# Patient Record
Sex: Female | Born: 1995 | Race: Black or African American | Hispanic: No | Marital: Married | Smoking: Never smoker
Health system: Southern US, Community
[De-identification: ages and names within clinical notes are randomized; demographics above are authoritative.]

## PROBLEM LIST (undated history)

## (undated) DIAGNOSIS — O165 Unspecified maternal hypertension, complicating the puerperium: Secondary | ICD-10-CM

## (undated) DIAGNOSIS — R768 Other specified abnormal immunological findings in serum: Secondary | ICD-10-CM

## (undated) HISTORY — DX: Other specified abnormal immunological findings in serum: R76.8

## (undated) HISTORY — DX: Unspecified maternal hypertension, complicating the puerperium: O16.5

## (undated) HISTORY — PX: NO PAST SURGERIES: SHX2092

---

## 2015-03-19 ENCOUNTER — Emergency Department (HOSPITAL_COMMUNITY)
Admission: EM | Admit: 2015-03-19 | Discharge: 2015-03-19 | Disposition: A | Discharge status: Home / Self Care | Payer: Medicaid Other | Attending: Emergency Medicine | Admitting: Emergency Medicine

## 2015-03-19 ENCOUNTER — Encounter (HOSPITAL_COMMUNITY): Payer: Self-pay | Admitting: Emergency Medicine

## 2015-03-19 DIAGNOSIS — Z789 Other specified health status: Secondary | ICD-10-CM

## 2015-03-19 DIAGNOSIS — Z309 Encounter for contraceptive management, unspecified: Secondary | ICD-10-CM | POA: Diagnosis not present

## 2015-03-19 NOTE — ED Provider Notes (Signed)
CSN: [id number]     Arrival date & time 03/19/15  1149 History  This chart was scribed for non-physician practitioner Fayrene Helper, PA, working with Lavera Guise, MD, by Tanda Rockers, ED Scribe. This patient was seen in room TR08C/TR08C and the patient's care was started at 1:09 PM.  Chief Complaint  Patient presents with  . birth control implant    The history is provided by the patient. The history is limited by a language barrier. A language interpreter was used.    HPI Comments: Tracy Archer is a 19 y.o. female who presents to the Emergency Department for removal of birth control implant in left upper arm so that she can conceive again. Pt reports getting the [place] 08[place]13/2014 ([place] She does not currently have a PCP in the area. Pt does not any complaints at this time.   History reviewed. No pertinent past medical history. History reviewed. No pertinent past surgical history. No family history on file. Social History  Substance Use Topics  . Smoking status: Never Smoker   . Smokeless tobacco: None  . Alcohol Use: No   OB History    No data available     Review of Systems  Constitutional: Negative for fever and chills.  HENT: Negative for congestion and rhinorrhea.   Gastrointestinal: Negative for nausea, vomiting and abdominal pain.  Genitourinary: Negative for dysuria and hematuria.   Allergies  Review of patient's allergies indicates no known allergies.  Home Medications   Prior to Admission medications   Not on File   Triage Vitals: BP 125/80 mmHg  Pulse 90  Temp(Src) 98.3 F (36.8 C) (Oral)  Resp 20  SpO2 99%   Physical Exam  Constitutional: She is oriented to person, place, and time. She appears well-developed and well-nourished. No distress.  HENT:  Head: Normocephalic and atraumatic.  Eyes: Conjunctivae and EOM are normal.  Neck: Neck supple. No tracheal deviation present.  Cardiovascular: Normal  rate.   Pulmonary/Chest: Effort normal. No respiratory distress.  Musculoskeletal: Normal range of motion.  Implant noted to left upper arm; No signs of infection  Neurological: She is alert and oriented to person, place, and time.  Skin: Skin is warm and dry.  Psychiatric: She has a normal mood and affect. Her behavior is normal.  Nursing note and vitals reviewed.   ED Course  Procedures (including critical care time)  DIAGNOSTIC STUDIES: Oxygen Saturation is 99% on RA, normal by my interpretation.    COORDINATION OF CARE: 1:15 PM-Discussed that we cannot remove the implant in the ED. Will give pt referral to [facility] since she does not have a PCP at this moment. Pt understands and agrees to plan.    Labs Review Labs Reviewed - No data to display  Imaging Review No results found.    EKG Interpretation None      MDM   Final diagnoses:  Uses birth control   BP 125/80 mmHg  Pulse 90  Temp(Src) 98.3 F (36.8 C) (Oral)  Resp 20  SpO2 99%   I personally performed the services described in this documentation, which was scribed in my presence. The recorded information has been reviewed and is accurate.       Fayrene Helper, PA-C 03/19/15 1326  Lavera Guise, MD 03/19/15 [phone]

## 2015-03-19 NOTE — Discharge Instructions (Signed)
°Emergency Department Resource Guide °1) Find a Doctor and Pay Out of Pocket °Although you won't have to find out who is covered by your insurance plan, it is a good idea to ask around and get recommendations. You will then need to call the office and see if the doctor you have chosen will accept you as a new patient and what types of options they offer for patients who are self-pay. Some doctors offer discounts or will set up payment plans for their patients who do not have insurance, but you will need to ask so you aren't surprised when you get to your appointment. ° °2) Contact Your Local Health Department °Not all health departments have doctors that can see patients for sick visits, but many do, so it is worth a call to see if yours does. If you don't know where your local health department is, you can check in your phone book. The CDC also has a tool to help you locate your state's health department, and many state websites also have listings of all of their local health departments. ° °3) Find a Walk-in Clinic °If your illness is not likely to be very severe or complicated, you may want to try a walk in clinic. These are popping up all over the country in pharmacies, drugstores, and shopping centers. They're usually staffed by nurse practitioners or physician assistants that have been trained to treat common illnesses and complaints. They're usually fairly quick and inexpensive. However, if you have serious medical issues or chronic medical problems, these are probably not your best option. ° °No Primary Care Doctor: °- Call [facility] at  832-8000 - they can help you locate a primary care doctor that  accepts your insurance, provides certain services, etc. °- Physician Referral Service- 1-800-533-3463 ° °Chronic Pain Problems: °Organization         Address  Phone   Notes  °[facility]  (336) 297-2271 Patients need to be referred by their primary care doctor.  ° °Medication  Assistance: °Organization         Address  Phone   Notes  °[place] [facility] [address] °[place], [place] [address] (336) 641-8030 --Must be a resident of [place] °-- Must have NO insurance coverage whatsoever (no Medicaid/ Medicare, etc.) °-- The pt. MUST have a primary care doctor that directs their care regularly and follows them in the community °  °[facility]  (866) 331-1348   °[employer]  (888) 892-1162   ° °Agencies that provide inexpensive medical care: °Organization         Address  Phone   Notes  °[facility]  (336) 832-8035   °[facility]    (336) 832-7272   °[facility] [address] °[place], [place] [address] (336) 832-4777   °[facility] [address], °[place] (336) 271-4999   °[facility]    (336) 373-0678   °[facility]    (336) 272-1050   °[facility] ° [address], [place] Phone:  (336) 832-4444, Fax:  (336) 832-4440 Hours of Operation:  9 am - 6 pm, M-F.  Also accepts Medicaid/Medicare and self-pay.  °[facility] ° [address], [place] Phone: (336) 832-3150, Fax: (336) 832-3151. Hours of Operation:  8:30 am - 5:30 pm, M-F.  Also accepts Medicaid and self-pay.  °[facility] [place] [address], [place] Phone: (336) 878-6027   °[facility] [address], [place], [place] (336)723-1848, Ext. 123 Mondays & Thursdays: 7-9 AM.  First 15 patients are seen on a first come, first serve basis. °  ° °Medicaid-accepting [place] Providers: ° °Organization         Address  Phone   Notes  °[facility] [address], [place] (336) 641-2100 Also accepts self-pay patients.  °[facility] [address], [place] ° (336) 856-9996   °[facility] [address], [place]  (336) 288-8857   °[facility] [address] [place] [address], [place] (336) 299-7000   °Veita Bland [address], [place]  ° (336) 373-1557 Only accepts [place] Access Medicaid patients after they have their name applied to their card.  ° °Self-Pay (no insurance) in [place]: ° °Organization         Address  Phone   Notes  °Sickle Cell Patients, [facility] [address], [place] (336) 832-1970   °[facility] [address], [place] (336) 832-4400   °[facility] [place] ° [address], [place] (336) 992-4800   °[facility]/Dr. Osei-Bonsu ° [address] [place] [address], [place] or [address], [place] (336) 841-8500 Phone number for both [place] and [place] locations is the same.  °[facility] [address], [place] (336) 299-0000   °[facility] [address] [place] [address], [employer] or [address] (336) 852-7530 °(336) 878-2260   °[facility] [address], [place] (336) 350-1642, phone; (336) 294-5005, fax Sees patients 1st and 3rd Saturday of every month.  Must not qualify for public or private insurance (i.e. Medicaid, Medicare, [place] Health Choice, Veterans' Benefits) • Household income should be no more than 200% of the poverty level •The clinic cannot treat you if you are pregnant or think you are pregnant • Sexually transmitted diseases are not treated at the clinic.  ° ° °Dental Care: °Organization         Address  Phone  Notes  °[place] [facility] [address], [place] (336) 641-6152 Accepts children up to age 21 who are enrolled in Medicaid or [place] Health Choice; pregnant women with a Medicaid card; and children who have applied for Medicaid or [place] Health Choice, but were declined, whose parents can pay a reduced fee at time of service.  °[place]  [facility] [place]  [address], [place] (336) 641-7733 Accepts children up to age 21 who are enrolled in Medicaid or [place] Health Choice; pregnant women with a Medicaid card; and children who have applied for Medicaid or [place] Health Choice, but were declined, whose parents can pay a reduced fee at time of service.  °[facility] ° [address], [place] (336) 641-4533 Patients are seen by appointment only. Walk-ins are not accepted. [facility] will see patients 18 years of age and older. °Monday - Tuesday (8am-5pm) °Most Wednesdays (8:30-5pm) °$30 per visit, cash only  °[facility] ° [address], [place] (336) 641-4533 Patients are seen by appointment only. Walk-ins are not accepted. [facility] will see patients 18 years of age and older. °One   Wednesday Evening (Monthly: Volunteer Based).  $30 per visit, cash only  °[facility]  (919) 537-3737 for adults; Children under age 4, call Graduate Pediatric Dentistry at (919) [phone]. Children aged 4-14, please call (919) 537-3737 to request a pediatric application. ° Dental services are provided in all areas of dental care including fillings, crowns and bridges, complete and partial dentures, implants, gum treatment, root canals, and extractions. Preventive care is also provided. Treatment is provided to both adults and children. °Patients are selected via a lottery and there is often a waiting list. °  °[facility] [address], °[place] ° (336) 763-8833 [email or web address] °  °[facility] [address], [place], [place] (336)723-1848, Ext. 123 Second and Fourth Thursday of each month, opens at 6:30 AM; Clinic ends at 9 AM.  Patients are seen on a first-come first-served basis, and a limited number are seen during each clinic.  ° °[facility] ° [address], [place], [place] (336) 723-7904    Eligibility Requirements °You must have lived in [place], [place], or [place] counties for at least the last three months. °  You cannot be eligible for state or federal sponsored healthcare insurance, including Veterans Administration, Medicaid, or Medicare. °  You generally cannot be eligible for healthcare insurance through your employer.  °  How to apply: °Eligibility screenings are held every Tuesday and Wednesday afternoon from 1:00 pm until 4:00 pm. You do not need an appointment for the interview!  °[facility] [address], [place], [place] 336-631-2330   °[facility]  336-342-8273   °[facility]  336-703-3100   °[facility]  336-570-6415   ° °Behavioral Health Resources in the Community: °Intensive Outpatient Programs °Organization         Address  Phone  Notes  °[facility] [address], [place], [place] 336-878-6098   °[facility] [address], [place], [place] 336-832-9800   °[facility] [address], [place], [place] ° 336-882-2125   °[facility] [address],  °[place], [place] 1-800-853-5163 or 336-641-4981   °Substance Abuse Resources °Organization         Address  Phone  Notes  °[facility]  336-882-2125   °[facility]  336-784-9470   °[facility]  336-285-9073   °[facility]  336-845-3988   °Residential & Outpatient Substance Abuse Program  1-800-659-3381   °Psychological Services °Organization         Address  Phone  Notes  °[facility]  336- 832-9600   °[facility]  336- 378-7881   °[facility] [address], [place] 1-800-853-5163 or 336-641-4981   ° °Mobile Crisis Teams °Organization         Address  Phone  Notes  °[facility], Mobile Crisis Care Unit  1-877-626-1772   °Assertive °[facility] ° [address]  [place], [place] 336-834-9664   °Sharon DeEsch [address] °[place] 336-554-5454   ° °Self-Help/Support Groups °Organization         Address  Phone             Notes  °[facility] [place] - variety of support groups  336- 373-1402 Call for more information  °Narcotics Anonymous (NA), [facility] [address], °[place]  2 meetings at this location  ° °  Residential Treatment Programs °Organization         Address  Phone  Notes  °[facility] [address],    °[place]  1-866-801-8205   °[facility] ° [address], [place], [place] 704-293-8524   °[facility] [address], [place] 336-845-3988 Admissions: 8am-3pm M-F  °[facility] [address]    °[place], [place] 336-841-1104   °[facility] [address], [place], [place] 336-379-7146   °[facility] [address]  °[place], [place] 336-285-9073   °[facility] - Intensive Outpatient [address], [place], [place] 336-852-3033   °[facility] [address]  °[place], [place] 1-877-615-2722 or 336-784-9470   °[facility] [address] [place], [place] 336-227-7417 Accepts Medicaid  °[facility] [address]  °[place] 1-800-659-3381 Substance Abuse/Addiction Treatment  ° °[place] Behavioral Health Resources °Organization         Address  Phone  Notes  °[facility]  (888) 581-9988   °Julie Brannon, PhD [address] [place], [place]   (336) 349-5553 or (336) 951-0000   °[facility]   [address] °[place], [place] (336) 349-4454   °[facility] [address], [place], [place] (336) 342-8316 Insurance/Medicaid/sponsorship through [facility]  °[facility] [address]                                    [place], [place] (336) 342-8316 Therapy/tele-psych/case    °[facility] [address]  ° [place], [place] (336) 349-2233    °Dr. Arfeen  (336) 349-4544   °[facility] [place]  [employer] [place] [facility] 1) [address], [place] °2) [address], [place] °3)  [address], [place] (336) 349-3220 °(336) 342-7768 ° °(336) 342-8140   °[place] [facility] (336) 342-1394 or (336) 342-3537 (After Hours)    ° ° °

## 2015-03-19 NOTE — ED Notes (Signed)
Patient speaks Swahili.  Used interpreter phone.   Patient has birth control implant in [place].   She wants to conceive again, and wants the implant removed.

## 2015-04-01 ENCOUNTER — Ambulatory Visit: Payer: Medicaid Other | Admitting: Family Medicine

## 2015-04-11 ENCOUNTER — Emergency Department (HOSPITAL_COMMUNITY)
Admission: EM | Admit: 2015-04-11 | Discharge: 2015-04-11 | Discharge status: Against Medical Advice | Payer: Medicaid Other | Attending: Emergency Medicine | Admitting: Emergency Medicine

## 2015-04-11 DIAGNOSIS — Z5321 Procedure and treatment not carried out due to patient leaving prior to being seen by health care provider: Secondary | ICD-10-CM | POA: Diagnosis not present

## 2015-04-22 ENCOUNTER — Ambulatory Visit: Payer: Medicaid Other | Attending: Internal Medicine | Admitting: Internal Medicine

## 2015-04-22 ENCOUNTER — Encounter: Payer: Self-pay | Admitting: Internal Medicine

## 2015-04-22 VITALS — BP 121/77 | HR 73 | Temp 98.7°F | Resp 16 | Ht 65.0 in | Wt 138.0 lb

## 2015-04-22 DIAGNOSIS — Z3046 Encounter for surveillance of implantable subdermal contraceptive: Secondary | ICD-10-CM | POA: Insufficient documentation

## 2015-04-22 NOTE — Progress Notes (Signed)
Patient ID: Tracy Archer, female   DOB: 05-31-1996, 19 y.o.   MRN: [id number]  QI:ONGEXBMWU removal   HPI: Tracy Archer is a 19 y.o. female here today to establish care.  Patient has no past medical history. Patient reports that she had a nexplanon [place] 8 [place] ago [place] before moving to the [place]. She now wants implant removed because she desires pregnancy in the near future. She has no other complaints today.   No Known Allergies History reviewed. No pertinent past medical history. No current outpatient prescriptions on file prior to visit.   No current facility-administered medications on file prior to visit.   History reviewed. No pertinent family history. Social History   Social History  . Marital Status: Married    Spouse Name: N/A  . Number of Children: N/A  . Years of Education: N/A   Occupational History  . Not on file.   Social History Main Topics  . Smoking status: Never Smoker   . Smokeless tobacco: Not on file  . Alcohol Use: No  . Drug Use: No  . Sexual Activity: Not on file   Other Topics Concern  . Not on file   Social History Narrative    Review of Systems: Constitutional: Negative for fever, chills, diaphoresis, activity change, appetite change and fatigue. HENT: Negative for ear pain, nosebleeds, congestion, facial swelling, rhinorrhea, neck pain, neck stiffness and ear discharge.  Eyes: Negative for pain, discharge, redness, itching and visual disturbance. Respiratory: Negative for cough, choking, chest tightness, shortness of breath, wheezing and stridor.  Cardiovascular: Negative for chest pain, palpitations and leg swelling. Gastrointestinal: Negative for abdominal distention. Genitourinary: Negative for dysuria, urgency, frequency, hematuria, flank pain, decreased urine volume, difficulty urinating and dyspareunia.  Musculoskeletal: Negative for back pain, joint swelling, arthralgias and gait problem. Neurological: Negative for  dizziness, tremors, seizures, syncope, facial asymmetry, speech difficulty, weakness, light-headedness, numbness and headaches.  Hematological: Negative for adenopathy. Does not bruise/bleed easily. Psychiatric/Behavioral: Negative for hallucinations, behavioral problems, confusion, dysphoric mood, decreased concentration and agitation.    Objective:   Filed Vitals:   04/22/15 1015  BP: 121/77  Pulse: 73  Temp: 98.7 F (37.1 C)  Resp: 16    Physical Exam: Constitutional: Patient appears well-developed and well-nourished. No distress. HENT: Normocephalic, atraumatic, External right and left ear normal. Oropharynx is clear and moist.  Eyes: Conjunctivae and EOM are normal. PERRLA, no scleral icterus. Neck: Normal ROM. Neck supple. No JVD. No tracheal deviation. No thyromegaly. CVS: RRR, S1/S2 +, no murmurs, no gallops, no carotid bruit.  Pulmonary: Effort and breath sounds normal, no stridor, rhonchi, wheezes, rales.  Abdominal: Soft. BS +,  no distension, tenderness, rebound or guarding.  Musculoskeletal: Normal range of motion. No edema and no tenderness. Left arm nexplanon felt under the skin. Lymphadenopathy: No lymphadenopathy noted, cervical, inguinal or axillary Neuro: Alert. Normal reflexes, muscle tone coordination. No cranial nerve deficit. Skin: Skin is warm and dry. No rash noted. Not diaphoretic. No erythema. No pallor. Psychiatric: Normal mood and affect. Behavior, judgment, thought content normal.  No results found for: WBC, HGB, HCT, MCV, PLT No results found for: CREATININE, BUN, NA, K, CL, CO2  No results found for: HGBA1C Lipid Panel  No results found for: CHOL, TRIG, HDL, CHOLHDL, VLDL, LDLCALC     Assessment and plan:   Tracy Archer was seen today for establish care.  Diagnoses and all orders for this visit:  Encounter for Nexplanon removal Nexplanon removal  consent, signed copy in the  chart. Appropriate time out taken The patient's left arm was prepped  and draped in the usual sterile fashion. Local anaesthesia obtained using 3 cc of 1% lidocaine. Nexplanon was removed per manufacturer's directions. Less than 3 cc blood loss. The removal site was cleaned with alcohol/betadine and a pressure bandage to minimize bruising. There were no complications and the patient tolerated the procedure well. She will return in 3 days for a wound recheck     Due to language barrier, an interpreter was present during the history-taking and subsequent discussion (and for part of the physical exam) with this patient.   Return in about 3 days (around 04/25/2015) for Wound recheck with RN--nexplanon removal site and PRN.       Ambrose FinlandValerie A Keck, NP-C [facility] [phone] 04/22/2015, 11:08 AM

## 2015-04-22 NOTE — Progress Notes (Signed)
[employer]pre[employer] Swahili [phone] C/C Implanon remova No pain today

## 2015-04-29 ENCOUNTER — Ambulatory Visit: Payer: Medicaid Other

## 2015-04-29 ENCOUNTER — Encounter: Payer: Self-pay | Admitting: Internal Medicine

## 2015-04-29 ENCOUNTER — Ambulatory Visit: Payer: Medicaid Other | Attending: Internal Medicine | Admitting: Internal Medicine

## 2015-04-29 VITALS — BP 103/67 | HR 91 | Temp 98.0°F | Resp 16 | Ht 65.0 in | Wt 140.0 lb

## 2015-04-29 DIAGNOSIS — Z5189 Encounter for other specified aftercare: Secondary | ICD-10-CM | POA: Diagnosis not present

## 2015-04-29 NOTE — Progress Notes (Signed)
Interpreter line used Patient is here just to have the site where her nexplanon was  Removed checked

## 2015-04-29 NOTE — Progress Notes (Signed)
 Patient ID: Tracy Archer, female   DOB: , 19 y.o.   MRN: [id number]  CC: wound check   HPI: Tracy GlazierFuraha Archer is a 19 y.o. female here today for a follow up wound check after a nexplanon removal last week. Patient reports that the wound has healed fine and she has no complications of procedure.  Wound appears well healed, no signs of infection.    She may follow up as needed  Ambrose FinlandValerie A Keck, NP 04/29/2015 5:20 PM

## 2015-06-28 NOTE — L&D Delivery Note (Signed)
Delivery Note Admitted in advanced active labor, shortly after arriving to room was anterior lip and pushing, so I was called. AROM bbow as baby was crowning- mod MSF.  At 10:42 AM a viable female was delivered via Vaginal, Spontaneous Delivery (Presentation: direct OA ).  APGAR: 9, 9; weight: pending at time of note. Vigorous infant placed directly on mom's abdomen for bonding/skin-to-skin. Delayed cord clamping, then cord clamped x 2, and cut by me (pt's sister, and pt did not want to).   Placenta status: delivered spontaneously, intact, calcifications covering entire maternal side .  Cord:  with the following complications: none .  Cord pH: not done  Anesthesia:  none Episiotomy: None Lacerations: None Suture Repair: n/a Est. Blood Loss (mL): 100  Mom to postpartum.  Baby to Nursery sometime after I left room (d/t low sats). Plans to breastfeed, no circumcision, undecided about contraception.  Pt did not receive Ampicillin for GBS+ d/t precipitous labor/birth  Noted to have few elevated bp's, asymptomatic, will add pre-e labs and monitor bp's pp.   Tracy Archer, Tracy Archer 03/12/2016, 12:05 PM

## 2015-08-31 ENCOUNTER — Other Ambulatory Visit (HOSPITAL_COMMUNITY): Payer: Self-pay | Admitting: Nurse Practitioner

## 2015-08-31 DIAGNOSIS — Z369 Encounter for antenatal screening, unspecified: Secondary | ICD-10-CM

## 2015-08-31 LAB — OB RESULTS CONSOLE ABO/RH: RH TYPE: NEGATIVE

## 2015-08-31 LAB — OB RESULTS CONSOLE GC/CHLAMYDIA
Chlamydia: NEGATIVE
Gonorrhea: NEGATIVE

## 2015-08-31 LAB — OB RESULTS CONSOLE RPR: RPR: NONREACTIVE

## 2015-08-31 LAB — OB RESULTS CONSOLE HEPATITIS B SURFACE ANTIGEN: HEP B S AG: NEGATIVE

## 2015-08-31 LAB — OB RESULTS CONSOLE RUBELLA ANTIBODY, IGM: Rubella: IMMUNE

## 2015-08-31 LAB — OB RESULTS CONSOLE HIV ANTIBODY (ROUTINE TESTING): HIV: NONREACTIVE

## 2015-08-31 LAB — OB RESULTS CONSOLE ANTIBODY SCREEN: Antibody Screen: NEGATIVE

## 2015-09-11 ENCOUNTER — Encounter (HOSPITAL_COMMUNITY): Payer: Self-pay

## 2015-09-11 ENCOUNTER — Other Ambulatory Visit (HOSPITAL_COMMUNITY): Payer: Self-pay | Admitting: Nurse Practitioner

## 2015-09-11 ENCOUNTER — Ambulatory Visit (HOSPITAL_COMMUNITY)
Admission: RE | Admit: 2015-09-11 | Discharge: 2015-09-11 | Disposition: A | Discharge status: Home / Self Care | Payer: Medicaid Other | Source: Ambulatory Visit | Attending: Nurse Practitioner | Admitting: Nurse Practitioner

## 2015-09-11 DIAGNOSIS — Z3A12 12 weeks gestation of pregnancy: Secondary | ICD-10-CM

## 2015-09-11 DIAGNOSIS — Z369 Encounter for antenatal screening, unspecified: Secondary | ICD-10-CM

## 2015-09-11 DIAGNOSIS — Z36 Encounter for antenatal screening of mother: Secondary | ICD-10-CM | POA: Diagnosis not present

## 2015-09-17 ENCOUNTER — Other Ambulatory Visit (HOSPITAL_COMMUNITY): Payer: Self-pay

## 2016-02-21 LAB — OB RESULTS CONSOLE GBS: GBS: POSITIVE

## 2016-03-12 ENCOUNTER — Inpatient Hospital Stay (HOSPITAL_COMMUNITY)
Admission: AD | Admit: 2016-03-12 | Discharge: 2016-03-14 | DRG: 775 | Disposition: A | Discharge status: Home / Self Care | Payer: Medicaid Other | Source: Ambulatory Visit | Attending: Obstetrics & Gynecology | Admitting: Obstetrics & Gynecology

## 2016-03-12 ENCOUNTER — Encounter (HOSPITAL_COMMUNITY): Payer: Self-pay | Admitting: Certified Nurse Midwife

## 2016-03-12 DIAGNOSIS — Z3483 Encounter for supervision of other normal pregnancy, third trimester: Secondary | ICD-10-CM | POA: Diagnosis present

## 2016-03-12 DIAGNOSIS — R768 Other specified abnormal immunological findings in serum: Secondary | ICD-10-CM

## 2016-03-12 DIAGNOSIS — Z6791 Unspecified blood type, Rh negative: Secondary | ICD-10-CM

## 2016-03-12 DIAGNOSIS — Z3A39 39 weeks gestation of pregnancy: Secondary | ICD-10-CM

## 2016-03-12 DIAGNOSIS — O26893 Other specified pregnancy related conditions, third trimester: Secondary | ICD-10-CM | POA: Diagnosis present

## 2016-03-12 DIAGNOSIS — IMO0001 Reserved for inherently not codable concepts without codable children: Secondary | ICD-10-CM

## 2016-03-12 DIAGNOSIS — O99824 Streptococcus B carrier state complicating childbirth: Principal | ICD-10-CM | POA: Diagnosis present

## 2016-03-12 HISTORY — DX: Other specified abnormal immunological findings in serum: R76.8

## 2016-03-12 LAB — CBC
HCT: 34.7 % — ABNORMAL LOW (ref 36.0–46.0)
Hemoglobin: 12.6 g/dL (ref 12.0–15.0)
MCH: 30.1 pg (ref 26.0–34.0)
MCHC: 36.3 g/dL — ABNORMAL HIGH (ref 30.0–36.0)
MCV: 82.8 fL (ref 78.0–100.0)
PLATELETS: 121 10*3/uL — AB (ref 150–400)
RBC: 4.19 MIL/uL (ref 3.87–5.11)
RDW: 13.2 % (ref 11.5–15.5)
WBC: 7.2 10*3/uL (ref 4.0–10.5)

## 2016-03-12 LAB — PROTEIN / CREATININE RATIO, URINE
CREATININE, URINE: 31 mg/dL
Protein Creatinine Ratio: 1.74 mg/mg{Cre} — ABNORMAL HIGH (ref 0.00–0.15)
TOTAL PROTEIN, URINE: 54 mg/dL

## 2016-03-12 LAB — COMPREHENSIVE METABOLIC PANEL
ALT: 13 U/L — AB (ref 14–54)
AST: 21 U/L (ref 15–41)
Albumin: 3.2 g/dL — ABNORMAL LOW (ref 3.5–5.0)
Alkaline Phosphatase: 178 U/L — ABNORMAL HIGH (ref 38–126)
Anion gap: 7 (ref 5–15)
BILIRUBIN TOTAL: 0.5 mg/dL (ref 0.3–1.2)
BUN: 6 mg/dL (ref 6–20)
CALCIUM: 8.9 mg/dL (ref 8.9–10.3)
CHLORIDE: 108 mmol/L (ref 101–111)
CO2: 19 mmol/L — ABNORMAL LOW (ref 22–32)
CREATININE: 0.63 mg/dL (ref 0.44–1.00)
Glucose, Bld: 100 mg/dL — ABNORMAL HIGH (ref 65–99)
Potassium: 3.8 mmol/L (ref 3.5–5.1)
Sodium: 134 mmol/L — ABNORMAL LOW (ref 135–145)
TOTAL PROTEIN: 7 g/dL (ref 6.5–8.1)

## 2016-03-12 LAB — ABO/RH: ABO/RH(D): O NEG

## 2016-03-12 LAB — TYPE AND SCREEN
ABO/RH(D): O NEG
Antibody Screen: NEGATIVE

## 2016-03-12 LAB — RAPID HIV SCREEN (HIV 1/2 AB+AG)
HIV 1/2 Antibodies: NONREACTIVE
HIV-1 P24 Antigen - HIV24: NONREACTIVE

## 2016-03-12 MED ORDER — SODIUM CHLORIDE 0.9 % IV SOLN
2.0000 g | Freq: Once | INTRAVENOUS | Status: DC
  Filled 2016-03-12: qty 2000

## 2016-03-12 MED ORDER — ONDANSETRON HCL 4 MG/2ML IJ SOLN: 4.0000 mg | Freq: Four times a day (QID) | INTRAMUSCULAR | Status: DC | PRN

## 2016-03-12 MED ORDER — ZOLPIDEM TARTRATE 5 MG PO TABS: 5.0000 mg | ORAL_TABLET | Freq: Every evening | ORAL | Status: DC | PRN

## 2016-03-12 MED ORDER — LACTATED RINGERS IV SOLN
500.0000 mL | INTRAVENOUS | Status: DC | PRN
  Administered 2016-03-12: 1000 mL via INTRAVENOUS

## 2016-03-12 MED ORDER — SOD CITRATE-CITRIC ACID 500-334 MG/5ML PO SOLN: 30.0000 mL | ORAL | Status: DC | PRN

## 2016-03-12 MED ORDER — OXYTOCIN BOLUS FROM INFUSION
500.0000 mL | Freq: Once | INTRAVENOUS | Status: AC
  Administered 2016-03-12: 500 mL via INTRAVENOUS

## 2016-03-12 MED ORDER — SENNOSIDES-DOCUSATE SODIUM 8.6-50 MG PO TABS
2.0000 | ORAL_TABLET | ORAL | Status: DC
  Administered 2016-03-13 – 2016-03-14 (×2): 2 via ORAL
  Filled 2016-03-12 (×2): qty 2

## 2016-03-12 MED ORDER — BISACODYL 10 MG RE SUPP: 10.0000 mg | Freq: Every day | RECTAL | Status: DC | PRN

## 2016-03-12 MED ORDER — HYDROXYZINE HCL 50 MG PO TABS
50.0000 mg | ORAL_TABLET | Freq: Four times a day (QID) | ORAL | Status: DC | PRN
  Filled 2016-03-12: qty 1

## 2016-03-12 MED ORDER — IBUPROFEN 600 MG PO TABS
600.0000 mg | ORAL_TABLET | Freq: Four times a day (QID) | ORAL | Status: DC | PRN
  Administered 2016-03-12 – 2016-03-14 (×6): 600 mg via ORAL
  Filled 2016-03-12 (×6): qty 1

## 2016-03-12 MED ORDER — SODIUM CHLORIDE 0.9% FLUSH: 3.0000 mL | INTRAVENOUS | Status: DC | PRN

## 2016-03-12 MED ORDER — ACETAMINOPHEN 325 MG PO TABS: 650.0000 mg | ORAL_TABLET | ORAL | Status: DC | PRN

## 2016-03-12 MED ORDER — LACTATED RINGERS IV SOLN: INTRAVENOUS | Status: DC

## 2016-03-12 MED ORDER — FLEET ENEMA 7-19 GM/118ML RE ENEM: 1.0000 | ENEMA | Freq: Every day | RECTAL | Status: DC | PRN

## 2016-03-12 MED ORDER — OXYTOCIN 40 UNITS IN LACTATED RINGERS INFUSION - SIMPLE MED
2.5000 [IU]/h | INTRAVENOUS | Status: DC
  Filled 2016-03-12: qty 1000

## 2016-03-12 MED ORDER — TETANUS-DIPHTH-ACELL PERTUSSIS 5-2.5-18.5 LF-MCG/0.5 IM SUSP: 0.5000 mL | Freq: Once | INTRAMUSCULAR | Status: DC

## 2016-03-12 MED ORDER — BENZOCAINE-MENTHOL 20-0.5 % EX AERO: 1.0000 "application " | INHALATION_SPRAY | CUTANEOUS | Status: DC | PRN

## 2016-03-12 MED ORDER — SIMETHICONE 80 MG PO CHEW: 80.0000 mg | CHEWABLE_TABLET | ORAL | Status: DC | PRN

## 2016-03-12 MED ORDER — FLEET ENEMA 7-19 GM/118ML RE ENEM: 1.0000 | ENEMA | RECTAL | Status: DC | PRN

## 2016-03-12 MED ORDER — COCONUT OIL OIL: 1.0000 "application " | TOPICAL_OIL | Status: DC | PRN

## 2016-03-12 MED ORDER — DIPHENHYDRAMINE HCL 25 MG PO CAPS: 25.0000 mg | ORAL_CAPSULE | Freq: Four times a day (QID) | ORAL | Status: DC | PRN

## 2016-03-12 MED ORDER — PRENATAL MULTIVITAMIN CH
1.0000 | ORAL_TABLET | Freq: Every day | ORAL | Status: DC
  Administered 2016-03-13 – 2016-03-14 (×2): 1 via ORAL
  Filled 2016-03-12 (×2): qty 1

## 2016-03-12 MED ORDER — SODIUM CHLORIDE 0.9 % IV SOLN: 250.0000 mL | INTRAVENOUS | Status: DC | PRN

## 2016-03-12 MED ORDER — HYDROMORPHONE HCL 2 MG PO TABS: 2.0000 mg | ORAL_TABLET | ORAL | Status: DC | PRN

## 2016-03-12 MED ORDER — SODIUM CHLORIDE 0.9% FLUSH: 3.0000 mL | Freq: Two times a day (BID) | INTRAVENOUS | Status: DC

## 2016-03-12 MED ORDER — ONDANSETRON HCL 4 MG PO TABS
4.0000 mg | ORAL_TABLET | ORAL | Status: DC | PRN
Start: 2016-03-12 — End: 2016-03-14

## 2016-03-12 MED ORDER — DIBUCAINE 1 % RE OINT: 1.0000 "application " | TOPICAL_OINTMENT | RECTAL | Status: DC | PRN

## 2016-03-12 MED ORDER — MEASLES, MUMPS & RUBELLA VAC ~~LOC~~ INJ
0.5000 mL | INJECTION | Freq: Once | SUBCUTANEOUS | Status: DC
  Filled 2016-03-12: qty 0.5

## 2016-03-12 MED ORDER — OXYCODONE-ACETAMINOPHEN 5-325 MG PO TABS: 2.0000 | ORAL_TABLET | ORAL | Status: DC | PRN

## 2016-03-12 MED ORDER — ONDANSETRON HCL 4 MG/2ML IJ SOLN: 4.0000 mg | INTRAMUSCULAR | Status: DC | PRN

## 2016-03-12 MED ORDER — WITCH HAZEL-GLYCERIN EX PADS: 1.0000 "application " | MEDICATED_PAD | CUTANEOUS | Status: DC | PRN

## 2016-03-12 MED ORDER — OXYCODONE-ACETAMINOPHEN 5-325 MG PO TABS: 1.0000 | ORAL_TABLET | ORAL | Status: DC | PRN

## 2016-03-12 MED ORDER — LIDOCAINE HCL (PF) 1 % IJ SOLN
30.0000 mL | INTRAMUSCULAR | Status: DC | PRN
  Filled 2016-03-12: qty 30

## 2016-03-12 MED ORDER — FENTANYL CITRATE (PF) 100 MCG/2ML IJ SOLN: 50.0000 ug | INTRAMUSCULAR | Status: DC | PRN

## 2016-03-12 NOTE — Lactation Note (Addendum)
This note was copied from a baby's chart. Lactation Consultation Note  Patient Name: Tracy Ottie GlazierFuraha Saraceno UJWJX'BToday's Date: 03/12/2016 Reason for consult: Initial assessment   Initial assessment with Exp BF mom of 9 hour old infant. Spoke with mom with assistance of [employer] and [employer] Interpreter Ureji # [phone]. Received report from Bary RichardSally Hodges that mom's HIV status was non-reactive.   Mom reports BF is going well. She reports she knows how to BF her child and does not need assistance. Enc mom to feed infant 8-12 x in 24 hours at first feeding cues. Infant was awake and crying when I went into the room, mom was changing his diaper and reports he just finished feeding, she was unable to tell me how long he fed. Showed them the feeding log and enc them to write down feeds, voids and stools. Mom and another female in the room.  [facility] and laughed when I asked if she has a pump at home. She reports she is able to hand express her breasts.   BF Resources Handout and LC Brochure given, Mom informed of LC phone # and IP/OP Services. Enc mom to call out to desk if feeding assistance needed.   Mom asked about dressing the baby, she had him swaddled in a thick blanket, enc her to wait until after his bath. She also was asking about taking a shower, told her she would need to talk to her nurse as she still has an IV. Report was given to VenezuelaSydney, Charity fundraiserN.    Maternal Data Formula Feeding for Exclusion: No Has patient been taught Hand Expression?: Yes Does the patient have breastfeeding experience prior to this delivery?: Yes  Feeding    LATCH Score/Interventions                      Lactation Tools Discussed/Used WIC Program: No   Consult Status Consult Status: Follow-up Date: 03/13/16 Follow-up type: In-patient    Silas FloodSharon S Laurana Magistro 03/12/2016, 8:16 PM

## 2016-03-12 NOTE — H&P (Signed)
Tracy Archer is a 20 y.o. G2P1001 female at 2623w0d by LMP c/w 13wk u/s, presenting in advanced active labor.   Reports active fetal movement, contractions: regular, vaginal bleeding: bloody show, membranes: intact. [facility]   Denies ha, visual changes, ruq/epigastric pain, n/v.    This pregnancy complicated by: GBS+, Rh neg  Prenatal History/Complications:  Term uncomplicated svb x 1  Past Medical History: Past Medical History:  Diagnosis Date  . Medical history non-contributory     Past Surgical History: Past Surgical History:  Procedure Laterality Date  . NO PAST SURGERIES      Obstetrical History: OB History    Gravida Para Term Preterm AB Living   2 1 1     1    SAB TAB Ectopic Multiple Live Births                  Social History: Social History   Social History  . Marital status: Married    Spouse name: N/A  . Number of children: N/A  . Years of education: N/A   Social History Main Topics  . Smoking status: Never Smoker  . Smokeless tobacco: Never Used  . Alcohol use No  . Drug use: No  . Sexual activity: Not Asked   Other Topics Concern  . None   Social History Narrative  . None    Family History: History reviewed. No pertinent family history.  Allergies: No Known Allergies  Prescriptions Prior to Admission  Medication Sig Dispense Refill Last Dose  . Prenatal Vit-Fe Fumarate-FA (MULTIVITAMIN-PRENATAL) 27-0.8 MG TABS tablet Take 1 tablet by mouth daily at 12 noon.       Review of Systems  Pertinent pos/neg as indicated in HPI  Blood pressure 139/87, pulse 69, temperature 97.7 F (36.5 C), temperature source Oral, resp. rate 18, height 5\' 4"  (1.626 m), weight 78.9 kg (174 lb), last menstrual period 06/13/2015. General appearance: alert, cooperative and moderate distress/labor Lungs: clear to auscultation bilaterally Heart: regular rate and rhythm Abdomen: gravid, soft, non-tender Extremities: trace  edema DTR's 2+, no clonus  Fetal monitoring: FHR: 135 bpm, variability: moderate,  Accelerations: Present,  decelerations:  Absent Uterine activity: q 2-803mins   Presentation: cephalic   Prenatal labs:  ABO, Rh:  O- Antibody:  neg Rubella: immune RPR:   nr HBsAg:   neg HIV:   nr GBS:   Pos  1 hr Glucola: 121 Genetic screening:  neg Anatomy US: normal   Assessment:  2623w0d SIUP  G2P1001  Advanced active labor  Cat 1 FHR  GBS  Pos  Plan:  Admit to BS  IV pain meds/epidural prn active labor  Expectant management  Anticipate NSVB  Ampicillin for GBS+/advanced labor   Plans to breastfeed  Contraception: undecided  Circumcision: no  [name], Breyah Akhter Randall CNM, WHNP-BC 03/12/2016, 10:06 AM

## 2016-03-12 NOTE — MAU Note (Signed)
Pt states she began contracting last night. Pt speaks Swahili.

## 2016-03-12 NOTE — Lactation Note (Signed)
This note was copied from a baby's chart. Lactation Consultation Note  Patient Name: Tracy Archer WUJWJ'XToday's Date: 03/12/2016   In reviewing mom's chart it was noted in Pediatric notes that mom had possible exposure to HIV during pregnancy. Last HIV test result from 08/2015 was negative. Spoke with Bary RichardSally Hodges and [place] [facility] [place] [facility] RN. Also called Dr. Ronalee RedHartsell who plans to review chart. Per Leonides SakeSally, Mom has an HIV testing pending that may yield report tomorrow. Betsy, RN Spoke with T/S OB to obtain rapid HIV due to HIV being contraidicated with breastfeeding. Mom has been BF infant today. Will follow up once lab results are complete.      Maternal Data    Feeding Feeding Type: Breast Fed  LATCH Score/Interventions Latch: Repeated attempts needed to sustain latch, nipple held in mouth throughout feeding, stimulation needed to elicit sucking reflex. Intervention(s): Adjust position;Assist with latch  Audible Swallowing: A few with stimulation Intervention(s): Skin to skin  Type of Nipple: Everted at rest and after stimulation  Comfort (Breast/Nipple): Soft / non-tender     Hold (Positioning): Assistance needed to correctly position infant at breast and maintain latch. Intervention(s): Breastfeeding basics reviewed;Support Pillows  LATCH Score: 7  Lactation Tools Discussed/Used     Consult Status      Ed BlalockSharon S Rilei Kravitz 03/12/2016, 5:27 PM

## 2016-03-13 LAB — KLEIHAUER-BETKE STAIN
# VIALS RHIG: 1
Fetal Cells %: 0 %
QUANTITATION FETAL HEMOGLOBIN: 0 mL

## 2016-03-13 LAB — HIV ANTIBODY (ROUTINE TESTING W REFLEX): HIV Screen 4th Generation wRfx: NONREACTIVE

## 2016-03-13 MED ORDER — RHO D IMMUNE GLOBULIN 1500 UNIT/2ML IJ SOSY
300.0000 ug | PREFILLED_SYRINGE | Freq: Once | INTRAMUSCULAR | Status: AC
Start: 2016-03-13 — End: 2016-03-13
  Administered 2016-03-13: 300 ug via INTRAVENOUS
  Filled 2016-03-13: qty 2

## 2016-03-13 NOTE — Progress Notes (Addendum)
Post Partum Day 1 Subjective: Eating, drinking, voiding, ambulating well.  +flatus.  Lochia and pain wnl.  Denies dizziness, lightheadedness, or sob. No complaints. Denies ha, visual changes, ruq/epigastric pain, n/v.    Objective: Blood pressure 115/71, pulse 80, temperature 98.1 F (36.7 C), temperature source Oral, resp. rate 12, height 5\' 4"  (1.626 m), weight 78.9 kg (174 lb), last menstrual period 06/13/2015, SpO2 100 %.  Physical Exam:  General: alert, cooperative and no distress Lochia: appropriate Uterine Fundus: firm Incision: n/a DVT Evaluation: No evidence of DVT seen on physical exam. Negative Homan's sign. No cords or calf tenderness. No significant calf/ankle edema.   Recent Labs  03/12/16 0955  HGB 12.6  HCT 34.7*    Assessment/Plan: Plan for discharge tomorrow, Breastfeeding and Contraception undecided  Intrapartum HTN, P:C ratio done from pp urine (ordered as cath, not sure if collected that way) was 1.74, platelets 121. Asymptomatic. Only 1 elevated bp right after delivery, all since have been normal. DTRs 2+, no clonus, trace BLE edema No circ GBS+ and was untreated d/t precipitous labor/birth, so will stay until tomorrow Called from nursery yesterday stating mom had HIV exposure during pregnancy- rapid HIV on admit was neg   LOS: 1 day   [name], Kimberly Randall 03/13/2016, 6:05 AM

## 2016-03-14 ENCOUNTER — Encounter (HOSPITAL_COMMUNITY): Payer: Self-pay | Admitting: *Deleted

## 2016-03-14 LAB — RH IG WORKUP (INCLUDES ABO/RH)
ABO/RH(D): O NEG
FETAL SCREEN: POSITIVE
GESTATIONAL AGE(WKS): 39
Unit division: 0

## 2016-03-14 MED ORDER — IBUPROFEN 600 MG PO TABS: 600.0000 mg | ORAL_TABLET | Freq: Four times a day (QID) | ORAL | 0 refills | Status: DC | PRN

## 2016-03-14 MED ORDER — PENICILLIN G BENZATHINE 1200000 UNIT/2ML IM SUSP
2.4000 10*6.[IU] | Freq: Once | INTRAMUSCULAR | Status: AC
  Administered 2016-03-14: 2.4 10*6.[IU] via INTRAMUSCULAR
  Filled 2016-03-14: qty 4

## 2016-03-14 NOTE — Progress Notes (Signed)
Contacted lab to request an rpr result from 03/12/16, stated the result should reported soon.

## 2016-03-14 NOTE — Discharge Summary (Signed)
OB Discharge Summary     Patient Name: Tracy Archer DOB: May 10, 1996 MRN: [id number]  Date of admission: 03/12/2016 Delivering MD: Shawna Clamp R   Date of discharge: 03/14/2016  Admitting diagnosis: DUE SEPT 23 IN LABOR Intrauterine pregnancy: [redacted]w[redacted]d     Secondary diagnosis:  Active Problems:   Active labor at term  Additional problems: n/a     Discharge diagnosis: Term Pregnancy Delivered                                                                                                Post partum procedures:n/a  Augmentation: n/a  Complications: None  Hospital course:  Onset of Labor With Vaginal Delivery     20 y.o. yo G2P1001 at [redacted]w[redacted]d was admitted in Active Labor on 03/12/2016. Patient had an uncomplicated labor course as follows:  Membrane Rupture Time/Date: 10:41 AM ,03/12/2016   Intrapartum Procedures: Episiotomy: None [1]                                         Lacerations:  None [1]  Patient had a delivery of a Viable infant. 03/12/2016  Information for the patient's newborn:  Karissa, Meenan [[id number]]  Delivery Method: Vaginal, Spontaneous Delivery (Filed from Delivery Summary)    Pateint had an uncomplicated postpartum course.  She is ambulating, tolerating a regular diet, passing flatus, and urinating well. Patient is discharged home in stable condition on 03/14/16.    Physical exam Vitals:   03/13/16 0000 03/13/16 0500 03/13/16 1849 03/14/16 0607  BP: 115/71 (!) 141/65 120/75 116/74  Pulse:  68 79 79  Resp:  18 16 18   Temp:  98.2 F (36.8 C) 98.2 F (36.8 C) 98 F (36.7 C)  TempSrc:  Oral Oral Oral  SpO2:   100%   Weight:      Height:       General: alert, cooperative and no distress Lochia: appropriate Uterine Fundus: firm Incision: N/A DVT Evaluation: No evidence of DVT seen on physical exam. Negative Homan's sign. No cords or calf tenderness. No significant calf/ankle edema. Labs: Lab Results  Component Value Date   WBC 7.2  03/12/2016   HGB 12.6 03/12/2016   HCT 34.7 (L) 03/12/2016   MCV 82.8 03/12/2016   PLT 121 (L) 03/12/2016   CMP Latest Ref Rng & Units 03/12/2016  Glucose 65 - 99 mg/dL 782(N)  BUN 6 - 20 mg/dL 6  Creatinine 5.62 - 1.30 mg/dL 8.65  Sodium 784 - 696 mmol/L 134(L)  Potassium 3.5 - 5.1 mmol/L 3.8  Chloride 101 - 111 mmol/L 108  CO2 22 - 32 mmol/L 19(L)  Calcium 8.9 - 10.3 mg/dL 8.9  Total Protein 6.5 - 8.1 g/dL 7.0  Total Bilirubin 0.3 - 1.2 mg/dL 0.5  Alkaline Phos 38 - 126 U/L 178(H)  AST 15 - 41 U/L 21  ALT 14 - 54 U/L 13(L)    Discharge instruction: per After Visit Summary and "Baby and Me Booklet".    Diet: routine diet  Activity: Advance as tolerated. Pelvic rest for 6 weeks.   Outpatient follow up:6 weeks Follow up Appt:No future appointments. Follow up Visit:No Follow-up on file.  Postpartum contraception: Natural Family Planning  Newborn Data: Live born female  Birth Weight: 6 lb 11.6 oz (3050 g) APGAR: 9, 9  Baby Feeding: Breast Disposition:home with mother   03/14/2016 Renetta ChalkAshley Ellis, Student-MidWife

## 2016-03-14 NOTE — Lactation Note (Signed)
This note was copied from a baby's chart. Lactation Consultation Note  Alphia MohSwahili Pacifica [phone]#[id number]. Mother states she hasn't been able to breastfeed on her L nipple because it is abnormal. L nipples is flatter and larger than R nipple.  Provided mother with a manual pump with #30 flange.  Prepumped to help evert nipple. Colostrum started flowing. She prepumped on left side and baby latched easily. Sucks and swallows observed. Mom encouraged to feed baby 8-12 times/24 hours and with feeding cues.  Reviewed engorgement care and monitoring voids/stools.   Patient Name: [name] WJXBJ'YToday's Date: 03/14/2016 Reason for consult: Follow-up assessment   Maternal Data    Feeding Feeding Type: Breast Fed  LATCH Score/Interventions Latch: Grasps breast easily, tongue down, lips flanged, rhythmical sucking.  Audible Swallowing: Spontaneous and intermittent Intervention(s): Skin to skin  Type of Nipple: Everted at rest and after stimulation (L semi flat)  Comfort (Breast/Nipple): Soft / non-tender     Hold (Positioning): Assistance needed to correctly position infant at breast and maintain latch.  LATCH Score: 9  Lactation Tools Discussed/Used     Consult Status Consult Status: Complete    Hardie PulleyBerkelhammer, Ruth Boschen 03/14/2016, 9:34 AM

## 2016-03-15 LAB — RPR, QUANT+TP ABS (REFLEX)
Rapid Plasma Reagin, Quant: 1:1 {titer} — ABNORMAL HIGH
T Pallidum Abs: NEGATIVE

## 2016-03-15 LAB — RPR: RPR Ser Ql: REACTIVE — AB

## 2016-03-16 ENCOUNTER — Encounter: Payer: Self-pay | Admitting: Obstetrics & Gynecology

## 2016-03-19 NOTE — Discharge Summary (Signed)
Discharge Summaries Cosign Needed  Date of Service: 03/14/2016 7:26 AM Tracy MoritaMarie D Bernadette Gores, CNM  Obstetrics  Expand All Collapse All   [] Hide copied text [] Hover for attribution information                          OB Discharge Summary                           Patient Name: Tracy Archer DOB:  MRN: [id number]  Date of admission: 03/12/2016 Delivering MD: Tracy Archer   Date of discharge: 03/14/2016  Admitting diagnosis: DUE SEPT 23 IN LABOR Intrauterine pregnancy: 1647w2d     Secondary diagnosis:  Active Problems:   Active labor at term  Additional problems: n/a                                      Discharge diagnosis: Term Pregnancy Delivered                                                                                                Post partum procedures:n/a  Augmentation: n/a  Complications: None  Hospital course:  Onset of Labor With Vaginal Delivery     20 y.o. yo G2P1001 at 247w2d was admitted in Active Labor on 03/12/2016. Patient had an uncomplicated labor course as follows:  Membrane Rupture Time/Date: 10:41 AM ,03/12/2016   Intrapartum Procedures: Episiotomy: None [1]                                         Lacerations:  None [1]  Patient had a delivery of a Viable infant. 03/12/2016  Information for the patient's newborn:  Tracy Archer, Boy Tracy Archer [[id number]][[id number]]  Delivery Method: Vaginal, Spontaneous Delivery (Filed from Delivery Summary)    Pateint had an uncomplicated postpartum course.  She is ambulating, tolerating a regular diet, passing flatus, and urinating well. Patient is discharged home in stable condition on 03/14/16.          Physical exam Vitals:   03/13/16 0000 03/13/16 0500 03/13/16 1849 03/14/16 0607  BP: 115/71 (!) 141/65 120/75 116/74  Pulse:  68 79 79  Resp:  18 16 18   Temp:  98.2 F (36.8 C) 98.2 F (36.8 C) 98 F (36.7 C)  TempSrc:  Oral Oral Oral  SpO2:   100%   Weight:      Height:        General: alert, cooperative and no distress Lochia: appropriate Uterine Fundus: firm Incision: N/A DVT Evaluation: No evidence of DVT seen on physical exam. Negative Homan's sign. No cords or calf tenderness. No significant calf/ankle edema. Labs: RecentLabs       Lab Results  Component Value Date   WBC 7.2 03/12/2016   HGB 12.6 03/12/2016   HCT 34.7 (L) 03/12/2016   MCV 82.8 03/12/2016   PLT 121 (L) 03/12/2016  CMP Latest Ref Rng & Units 03/12/2016  Glucose 65 - 99 mg/dL 213(Y)  BUN 6 - 20 mg/dL 6  Creatinine 8.65 - 7.84 mg/dL 6.96  Sodium 295 - 284 mmol/L 134(L)  Potassium 3.5 - 5.1 mmol/L 3.8  Chloride 101 - 111 mmol/L 108  CO2 22 - 32 mmol/L 19(L)  Calcium 8.9 - 10.3 mg/dL 8.9  Total Protein 6.5 - 8.1 g/dL 7.0  Total Bilirubin 0.3 - 1.2 mg/dL 0.5  Alkaline Phos 38 - 126 U/L 178(H)  AST 15 - 41 U/L 21  ALT 14 - 54 U/L 13(L)    Discharge instruction: per After Visit Summary and "Baby and Me Booklet".    Diet: routine diet  Activity: Advance as tolerated. Pelvic rest for 6 weeks.   Outpatient follow up:6 weeks Follow up Appt:No future appointments. Follow up Visit:No Follow-up on file.  Postpartum contraception: Natural Family Planning  Newborn Data: Live born female  Birth Weight: 6 lb 11.6 oz (3050 g) APGAR: 9, 9  Baby Feeding: Breast Disposition:home with mother   03/14/2016 Tracy Archer, Student-MidWife      Electronically signed by Tracy Archer, Student-MidWife at 03/14/2016 7:28 AM Electronically signed by Tracy Archer, CNM at 03/19/2016 3:52 PM      Admission (Discharged) on 03/12/2016        Revision History        Detailed Report

## 2017-06-27 NOTE — L&D Delivery Note (Signed)
Delivery Note Pt progressed quickly to complete and pushed well. First dose of Amp 2gm was infusing at time of delivery. At 7:43 AM a viable female was delivered via Vaginal, Spontaneous (Presentation: LOA).  APGAR: 8, 9; weight: pending .  Infant dried and placed on pt's abd. Cord clamped and cut by CNM; hospital cord blood sample collected. Placenta status: spont, intact.  Cord: 3 vessel  Anesthesia:  None Episiotomy: None Lacerations: None Est. Blood Loss (mL): 100  Mom to postpartum.  Baby to Couplet care / Skin to Skin.  Cam HaiSHAW, KIMBERLY CNM 10/14/2017, 8:12 AM

## 2017-07-16 IMAGING — US US MFM FETAL NUCHAL TRANSLUCENCY
2 series · 15 of 28 positions shown · non-contrast
Comparison: none

[Series 1: us mfm fetal nuchal translucency · 12 of 32 slices shown (1 of 2)]
[im 1/32]
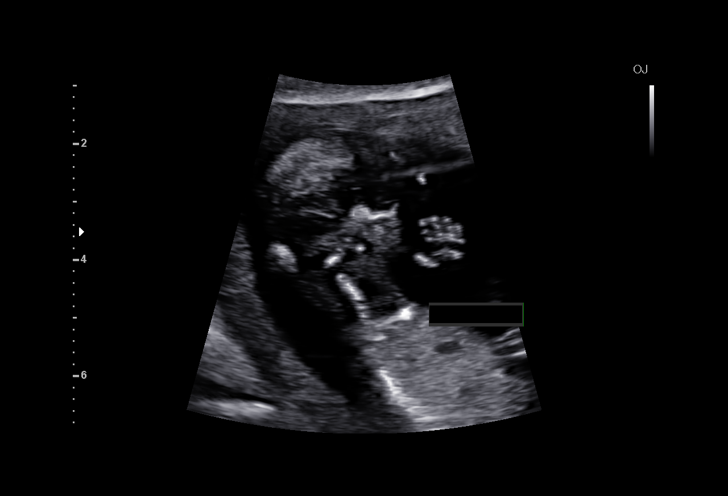
[im 3/32]
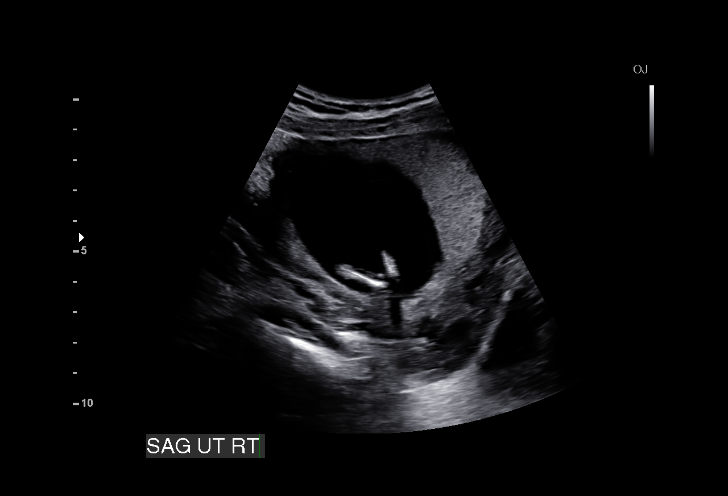
[im 6/32]
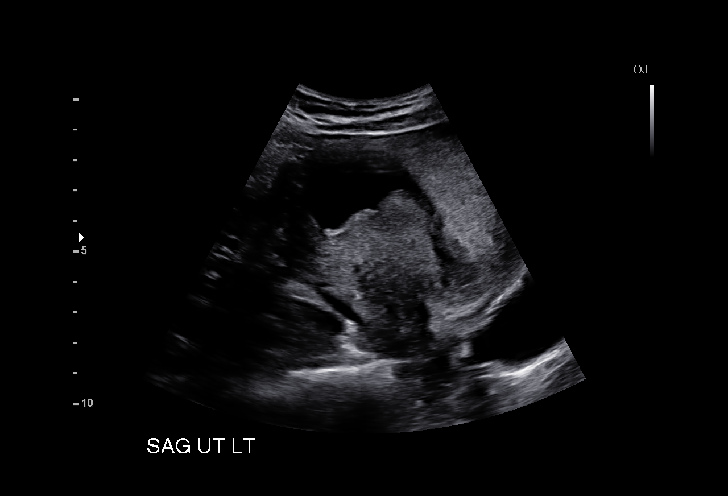
[im 9/32]
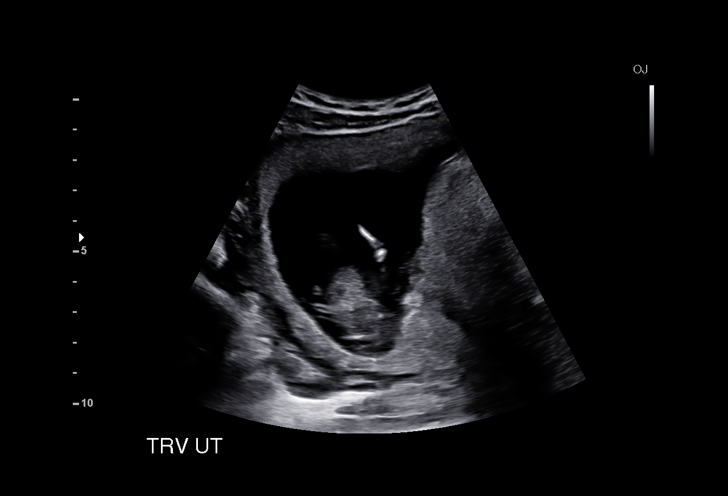
[im 12/32]
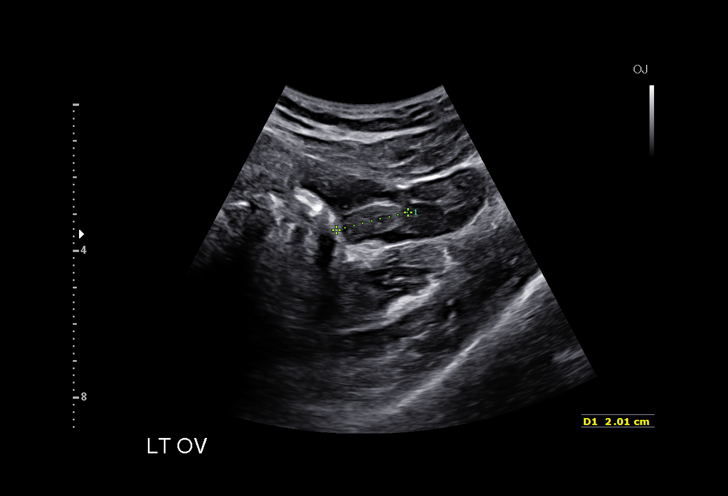
[im 15/32]
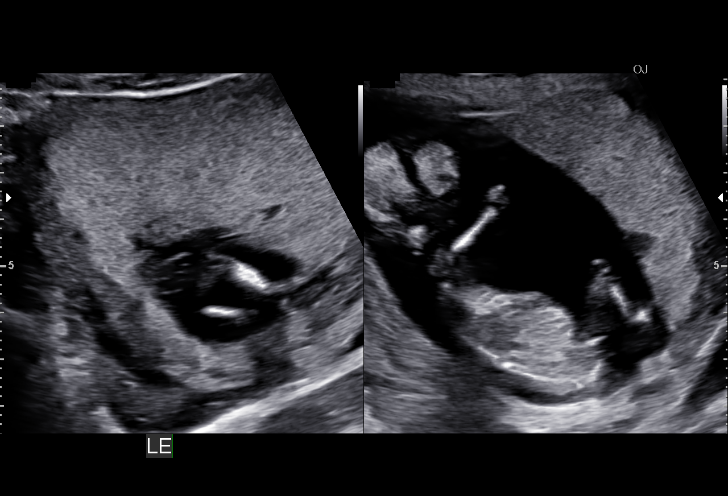
[im 18/32]
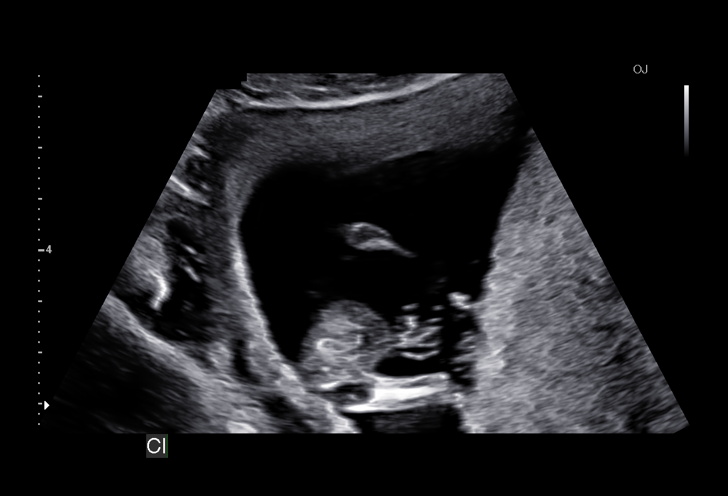
[im 21/32]
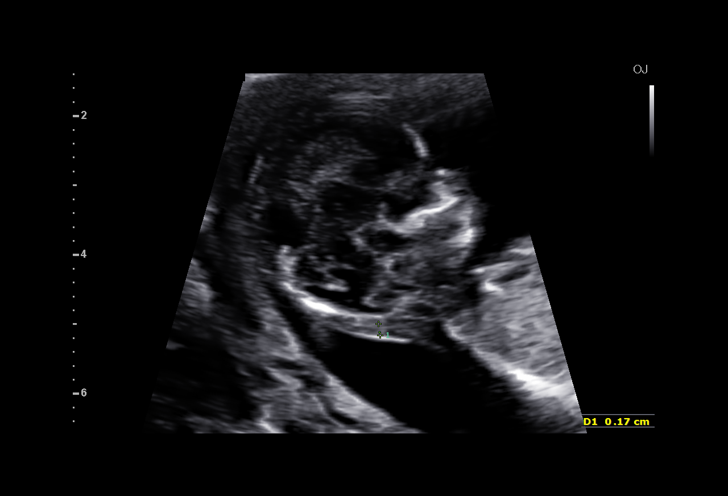
[im 23/32]
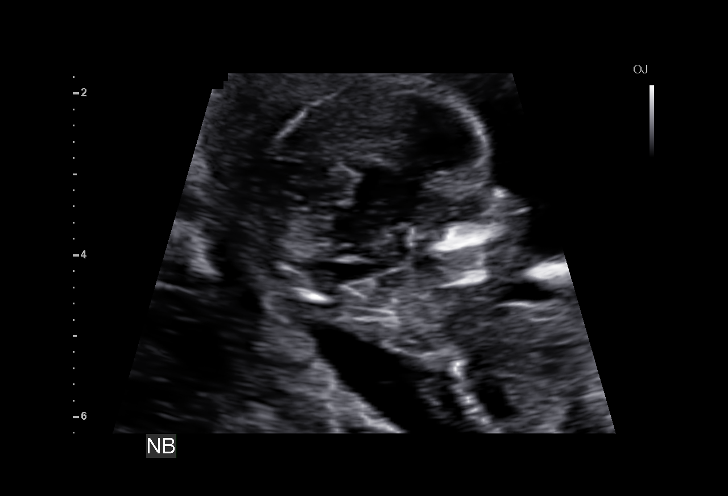
[im 26/32]
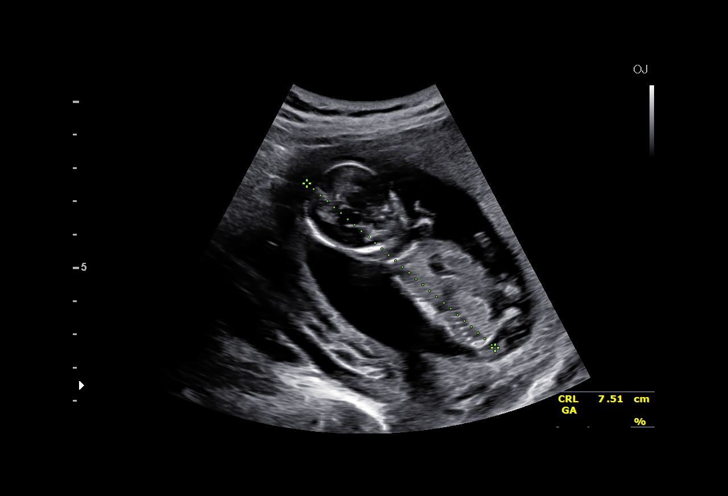
[im 29/32]
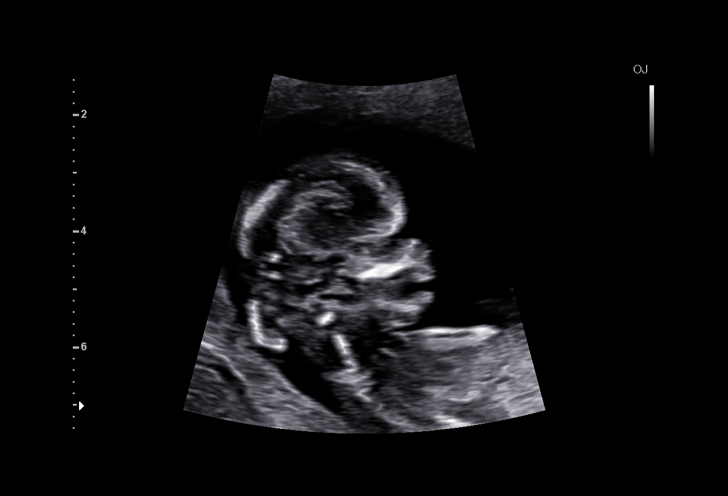
[im 32/32]
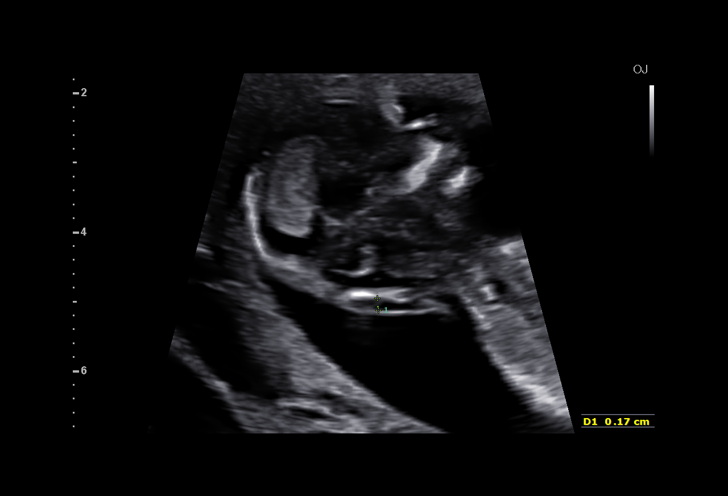

[Series 3: us mfm fetal nuchal translucency · 3 of 8 slices shown (2 of 2)]
[im 2/8]
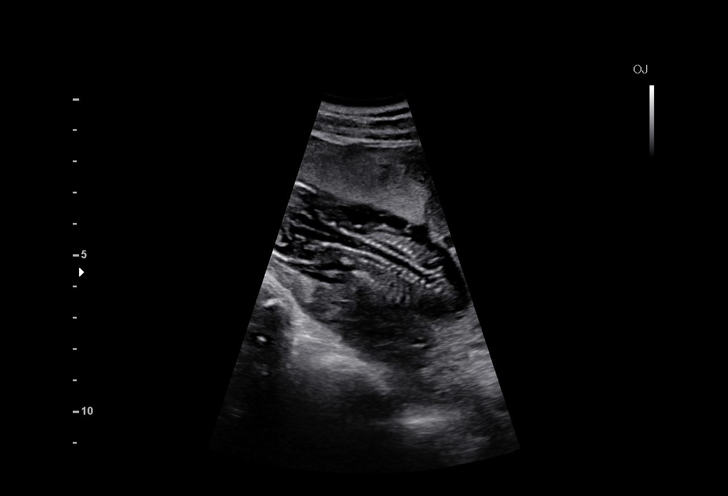
[im 5/8]
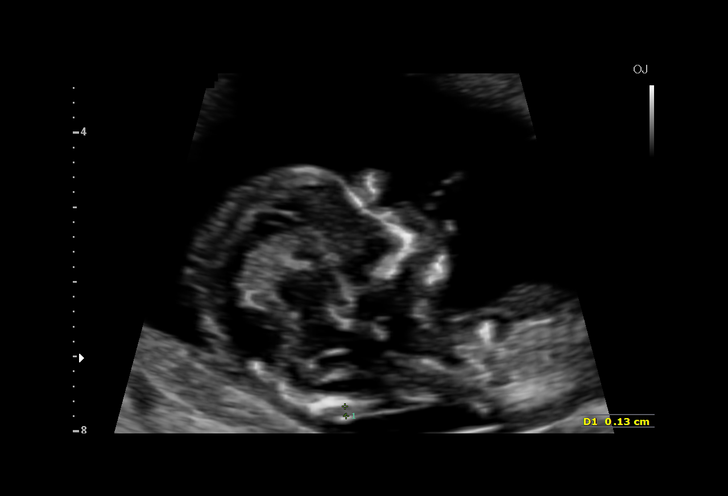
[im 8/8]
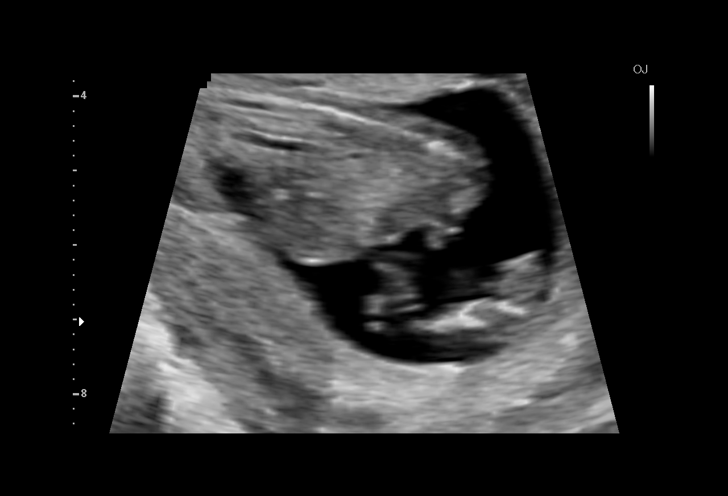

[15 of 28 positions shown; findings below may reference images not displayed]

Health
KITTRANANDEN

TRANSLUCENCY

1  MAGDA M                [id number]      [id number]     [id number]
KITTRANANDEN
Indications

12 weeks gestation of pregnancy
First trimester aneuploidy screen (NT)         Z36
OB History

Height:       5'5"    Weight:   140       BMI:
Gravidity:    2         Term:   1        Prem:   0        SAB:   0
TOP:          0       Ectopic:  0        Living: 1
Fetal Evaluation

Num Of Fetuses:     1
Preg. Location:     Intrauterine
Gest. Sac:          Intrauterine
Fetal Pole:         Visualized
Fetal Heart         157
Rate(bpm):
Cardiac Activity:   Observed
Biometry

CRL:      72.2  mm     G. Age:  13w 1d                  EDD:   03/17/16
Gestational Age

LMP:           12w 6d       Date:   06/13/15                 EDD:   03/19/16
Best:          12w 6d    Det. By:   LMP  (06/13/15)          EDD:   03/19/16
1st Trimester Genetic Sonogram Screening

CRL:            72.2  mm    G. Age:   13w 1d                 EDD:   03/17/16
Nuc Trans:       1.5  mm
Nasal Bone:                 Present
Anatomy

Cranium:          Appears normal         Abdomen:          Appears normal
Choroid Plexus:   Appears normal         Abdominal Wall:   Appears nml (cord
insert, abd wall)
Stomach:          Appears normal, left
sided
Impression

SIUP at 12+6 weeks
No gross abnormalities identified
NT measurement was within normal limits for this GA; NB
present
Normal amniotic fluid volume
Measurements consistent with LMP dating
Recommendations

Offer MSAFP in the second trimester for ONTD screening
Offer anatomy U/S by 18 weeks

## 2017-10-14 ENCOUNTER — Inpatient Hospital Stay (HOSPITAL_COMMUNITY)
Admission: AD | Admit: 2017-10-14 | Discharge: 2017-10-16 | DRG: 807 | Disposition: A | Discharge status: Home / Self Care | Payer: Medicaid Other | Source: Ambulatory Visit | Attending: Family Medicine | Admitting: Family Medicine

## 2017-10-14 ENCOUNTER — Encounter (HOSPITAL_COMMUNITY): Payer: Self-pay | Admitting: *Deleted

## 2017-10-14 ENCOUNTER — Other Ambulatory Visit: Payer: Self-pay

## 2017-10-14 DIAGNOSIS — O1414 Severe pre-eclampsia complicating childbirth: Principal | ICD-10-CM | POA: Diagnosis present

## 2017-10-14 DIAGNOSIS — O99824 Streptococcus B carrier state complicating childbirth: Secondary | ICD-10-CM | POA: Diagnosis present

## 2017-10-14 DIAGNOSIS — Z3A39 39 weeks gestation of pregnancy: Secondary | ICD-10-CM | POA: Diagnosis not present

## 2017-10-14 DIAGNOSIS — O1495 Unspecified pre-eclampsia, complicating the puerperium: Secondary | ICD-10-CM | POA: Diagnosis not present

## 2017-10-14 LAB — CBC
HCT: 34.6 % — ABNORMAL LOW (ref 36.0–46.0)
Hemoglobin: 12.4 g/dL (ref 12.0–15.0)
MCH: 30.2 pg (ref 26.0–34.0)
MCHC: 35.8 g/dL (ref 30.0–36.0)
MCV: 84.4 fL (ref 78.0–100.0)
Platelets: 154 10*3/uL (ref 150–400)
RBC: 4.1 MIL/uL (ref 3.87–5.11)
RDW: 12.8 % (ref 11.5–15.5)
WBC: 8.9 10*3/uL (ref 4.0–10.5)

## 2017-10-14 LAB — COMPREHENSIVE METABOLIC PANEL
ALK PHOS: 416 U/L — AB (ref 38–126)
ALT: 15 U/L (ref 14–54)
AST: 21 U/L (ref 15–41)
Albumin: 3 g/dL — ABNORMAL LOW (ref 3.5–5.0)
Anion gap: 12 (ref 5–15)
BILIRUBIN TOTAL: 0.3 mg/dL (ref 0.3–1.2)
BUN: 10 mg/dL (ref 6–20)
CALCIUM: 8.9 mg/dL (ref 8.9–10.3)
CO2: 16 mmol/L — ABNORMAL LOW (ref 22–32)
CREATININE: 0.78 mg/dL (ref 0.44–1.00)
Chloride: 107 mmol/L (ref 101–111)
GFR calc Af Amer: >60 mL/min (ref >60)
Glucose, Bld: 84 mg/dL (ref 65–99)
POTASSIUM: 3.9 mmol/L (ref 3.5–5.1)
Sodium: 135 mmol/L (ref 135–145)
TOTAL PROTEIN: 6.6 g/dL (ref 6.5–8.1)

## 2017-10-14 LAB — PROTEIN / CREATININE RATIO, URINE
CREATININE, URINE: 19 mg/dL
Total Protein, Urine: <6 mg/dL

## 2017-10-14 MED ORDER — LACTATED RINGERS IV SOLN
INTRAVENOUS | Status: DC
  Administered 2017-10-14: 07:00:00 via INTRAVENOUS

## 2017-10-14 MED ORDER — PRENATAL MULTIVITAMIN CH
1.0000 | ORAL_TABLET | Freq: Every day | ORAL | Status: DC
  Administered 2017-10-14 – 2017-10-15 (×2): 1 via ORAL
  Filled 2017-10-14 (×2): qty 1

## 2017-10-14 MED ORDER — DIPHENHYDRAMINE HCL 25 MG PO CAPS: 25.0000 mg | ORAL_CAPSULE | Freq: Four times a day (QID) | ORAL | Status: DC | PRN

## 2017-10-14 MED ORDER — DIBUCAINE 1 % RE OINT: 1.0000 "application " | TOPICAL_OINTMENT | RECTAL | Status: DC | PRN

## 2017-10-14 MED ORDER — OXYTOCIN 10 UNIT/ML IJ SOLN
INTRAMUSCULAR | Status: AC
  Filled 2017-10-14: qty 1

## 2017-10-14 MED ORDER — LIDOCAINE HCL (PF) 1 % IJ SOLN
INTRAMUSCULAR | Status: AC
  Filled 2017-10-14: qty 30

## 2017-10-14 MED ORDER — OXYTOCIN 40 UNITS IN LACTATED RINGERS INFUSION - SIMPLE MED
INTRAVENOUS | Status: AC
  Filled 2017-10-14: qty 1000

## 2017-10-14 MED ORDER — SODIUM CHLORIDE 0.9 % IV SOLN
2.0000 g | INTRAVENOUS | Status: AC
  Administered 2017-10-14: 2 g via INTRAVENOUS
  Filled 2017-10-14: qty 2

## 2017-10-14 MED ORDER — OXYTOCIN BOLUS FROM INFUSION
500.0000 mL | Freq: Once | INTRAVENOUS | Status: AC
  Administered 2017-10-14: 500 mL via INTRAVENOUS

## 2017-10-14 MED ORDER — ACETAMINOPHEN 325 MG PO TABS: 650.0000 mg | ORAL_TABLET | ORAL | Status: DC | PRN

## 2017-10-14 MED ORDER — SENNOSIDES-DOCUSATE SODIUM 8.6-50 MG PO TABS
2.0000 | ORAL_TABLET | ORAL | Status: DC
  Administered 2017-10-15 – 2017-10-16 (×2): 2 via ORAL
  Filled 2017-10-14 (×2): qty 2

## 2017-10-14 MED ORDER — SOD CITRATE-CITRIC ACID 500-334 MG/5ML PO SOLN: 30.0000 mL | ORAL | Status: DC | PRN

## 2017-10-14 MED ORDER — WITCH HAZEL-GLYCERIN EX PADS: 1.0000 "application " | MEDICATED_PAD | CUTANEOUS | Status: DC | PRN

## 2017-10-14 MED ORDER — OXYTOCIN 40 UNITS IN LACTATED RINGERS INFUSION - SIMPLE MED: 2.5000 [IU]/h | INTRAVENOUS | Status: DC

## 2017-10-14 MED ORDER — OXYCODONE-ACETAMINOPHEN 5-325 MG PO TABS: 2.0000 | ORAL_TABLET | ORAL | Status: DC | PRN

## 2017-10-14 MED ORDER — MAGNESIUM SULFATE BOLUS VIA INFUSION
4.0000 g | Freq: Once | INTRAVENOUS | Status: AC
  Administered 2017-10-14: 4 g via INTRAVENOUS
  Filled 2017-10-14: qty 500

## 2017-10-14 MED ORDER — ZOLPIDEM TARTRATE 5 MG PO TABS: 5.0000 mg | ORAL_TABLET | Freq: Every evening | ORAL | Status: DC | PRN

## 2017-10-14 MED ORDER — OXYCODONE HCL 5 MG PO TABS: 5.0000 mg | ORAL_TABLET | ORAL | Status: DC | PRN

## 2017-10-14 MED ORDER — LACTATED RINGERS IV SOLN
INTRAVENOUS | Status: DC
  Administered 2017-10-14 – 2017-10-15 (×3): via INTRAVENOUS

## 2017-10-14 MED ORDER — MAGNESIUM SULFATE 40 G IN LACTATED RINGERS - SIMPLE
2.0000 g/h | INTRAVENOUS | Status: AC
  Administered 2017-10-15: 2 g/h via INTRAVENOUS
  Filled 2017-10-14: qty 40
  Filled 2017-10-14: qty 500

## 2017-10-14 MED ORDER — ONDANSETRON HCL 4 MG/2ML IJ SOLN: 4.0000 mg | INTRAMUSCULAR | Status: DC | PRN

## 2017-10-14 MED ORDER — LIDOCAINE HCL (PF) 1 % IJ SOLN
30.0000 mL | INTRAMUSCULAR | Status: DC | PRN
  Filled 2017-10-14: qty 30

## 2017-10-14 MED ORDER — OXYCODONE-ACETAMINOPHEN 5-325 MG PO TABS: 1.0000 | ORAL_TABLET | ORAL | Status: DC | PRN

## 2017-10-14 MED ORDER — IBUPROFEN 600 MG PO TABS
600.0000 mg | ORAL_TABLET | Freq: Four times a day (QID) | ORAL | Status: DC
  Administered 2017-10-14 – 2017-10-16 (×8): 600 mg via ORAL
  Filled 2017-10-14 (×8): qty 1

## 2017-10-14 MED ORDER — BENZOCAINE-MENTHOL 20-0.5 % EX AERO: 1.0000 "application " | INHALATION_SPRAY | CUTANEOUS | Status: DC | PRN

## 2017-10-14 MED ORDER — SIMETHICONE 80 MG PO CHEW: 80.0000 mg | CHEWABLE_TABLET | ORAL | Status: DC | PRN

## 2017-10-14 MED ORDER — ONDANSETRON HCL 4 MG PO TABS: 4.0000 mg | ORAL_TABLET | ORAL | Status: DC | PRN

## 2017-10-14 MED ORDER — PENICILLIN G POT IN DEXTROSE 60000 UNIT/ML IV SOLN
3.0000 10*6.[IU] | INTRAVENOUS | Status: DC
  Filled 2017-10-14: qty 50

## 2017-10-14 MED ORDER — TETANUS-DIPHTH-ACELL PERTUSSIS 5-2.5-18.5 LF-MCG/0.5 IM SUSP: 0.5000 mL | Freq: Once | INTRAMUSCULAR | Status: DC

## 2017-10-14 MED ORDER — ONDANSETRON HCL 4 MG/2ML IJ SOLN: 4.0000 mg | Freq: Four times a day (QID) | INTRAMUSCULAR | Status: DC | PRN

## 2017-10-14 MED ORDER — LACTATED RINGERS IV SOLN: 500.0000 mL | INTRAVENOUS | Status: DC | PRN

## 2017-10-14 MED ORDER — COCONUT OIL OIL: 1.0000 "application " | TOPICAL_OIL | Status: DC | PRN

## 2017-10-14 MED ORDER — SODIUM CHLORIDE 0.9 % IV SOLN
5.0000 10*6.[IU] | Freq: Once | INTRAVENOUS | Status: DC
  Filled 2017-10-14: qty 5

## 2017-10-14 NOTE — Progress Notes (Signed)
Interpreter [phone] [facility]  Instruction given r.e. Baby in room and safety.  Patient states understanding and has no questions at this time.  Will continue to monitor.

## 2017-10-14 NOTE — Progress Notes (Signed)
Interpreter [phone]#[id number] used to explain to pt that she is being started on Magnesium Sulfate as ordered by MD. She states she understands. Pt Still denies any headache, blurred vision or floaters. Pt aware we will have to keep up with intake and output on this medication. Pt states she has no questions or concerns at this time. Will continue to monitor.

## 2017-10-14 NOTE — Progress Notes (Signed)
Interpreter # [phone] used to explain lab work, urine sample via I/o cath and blood pressures. Pt still denies any pain. Pt denies blurred vision, floaters, or head ache at this time. Pt refusing Motrin also. CNM updated and aware. Will continue to monitor.

## 2017-10-14 NOTE — H&P (Signed)
LABOR AND DELIVERY ADMISSION HISTORY AND PHYSICAL NOTE  Tracy Archer is a 22 y.o. female G3P1001 with IUP at [redacted]w[redacted]d by LMP presenting for SOL.  She reports positive fetal movement. She denies leakage of fluid or vaginal bleeding.  Prenatal History/Complications: PNC at HD Pregnancy complications:  - None   Past Medical History: Past Medical History:  Diagnosis Date  . Biological false positive RPR test 03/12/2016   Quant was 1:1, negative confirmatory T.pallidum antibodies    Past Surgical History: Past Surgical History:  Procedure Laterality Date  . NO PAST SURGERIES      Obstetrical History: OB History    Gravida  3   Para  1   Term  1   Preterm      AB      Living  1     SAB      TAB      Ectopic      Multiple      Live Births              Social History: Social History   Socioeconomic History  . Marital status: Married    Spouse name: Not on file  . Number of children: Not on file  . Years of education: Not on file  . Highest education level: Not on file  Occupational History  . Not on file  Social Needs  . Financial resource strain: Not on file  . Food insecurity:    Worry: Not on file    Inability: Not on file  . Transportation needs:    Medical: Not on file    Non-medical: Not on file  Tobacco Use  . Smoking status: Never Smoker  . Smokeless tobacco: Never Used  Substance and Sexual Activity  . Alcohol use: No  . Drug use: No  . Sexual activity: Not Currently  Lifestyle  . Physical activity:    Days per week: Not on file    Minutes per session: Not on file  . Stress: Not on file  Relationships  . Social connections:    Talks on phone: Not on file    Gets together: Not on file    Attends religious service: Not on file    Active member of club or organization: Not on file    Attends meetings of clubs or organizations: Not on file    Relationship status: Not on file  Other Topics Concern  . Not on file  Social History  Narrative  . Not on file    Family History: No family history on file.  Allergies: No Known Allergies  Medications Prior to Admission  Medication Sig Dispense Refill Last Dose  . ibuprofen (ADVIL,MOTRIN) 600 MG tablet Take 1 tablet (600 mg total) by mouth every 6 (six) hours as needed for moderate pain. 30 tablet 0   . Prenatal Vit-Fe Fumarate-FA (MULTIVITAMIN-PRENATAL) 27-0.8 MG TABS tablet Take 1 tablet by mouth daily at 12 noon.    03/11/2016 at Unknown time     Review of Systems  All systems reviewed and negative except as stated in HPI  Physical Exam Blood pressure 133/86, pulse 61, temperature 97.8 F (36.6 C), temperature source Oral, resp. rate 18, unknown if currently breastfeeding. General appearance: alert, cooperative and no distress Lungs: clear to auscultation bilaterally Heart: regular rate and rhythm Abdomen: soft, non-tender; bowel sounds normal Extremities: No calf swelling or tenderness Presentation: cephalic by cervical exam  Fetal monitoring: 135/ moderate/ +accels/ no decels  Uterine activity: 2-4/ moderate  by palpation  Dilation: 5 Effacement (%): 80 Station: -2 Exam by:: ansah-mensah, rnc   Prenatal labs: ABO, Rh:  O Pos (12/20) Antibody:  Negative (12/20) Rubella:  Immune (12/20) RPR:   False positive (12/20) HBsAg:   Negative (12/20) HIV:   Non Reactive (12/20) GC/Chlamydia: Negative (09/28/17) GBS:   Positive (09/28/17) 1 hr Glucola: 121 (2/27) Genetic screening:  Declined  Anatomy US: Normal female   Prenatal Transfer Tool  Maternal Diabetes: No Genetic Screening: Declined Maternal Ultrasounds/Referrals: Normal Fetal Ultrasounds or other Referrals:  None Maternal Substance Abuse:  No Significant Maternal Medications:  None Significant Maternal Lab Results: Lab values include: Group B Strep positive  No results found for this or any previous visit (from the past 24 hour(s)).  Patient Active Problem List   Diagnosis Date Noted  .  Biological false positive RPR test 03/12/2016    Assessment: Tracy Archer is a 22 y.o. G3P1001 at 2061w4d here for SOL  #Labor: Progressing well. Expectant management  #Pain: Plans natural delivery  #FWB: Cat I #ID:  GBS Pos  #MOF: Breast/Bottle  #MOC:Unsure  #Circ:  No   Sharyon CableRogers, Lovinia Snare C, CNM 10/14/2017, 6:51 AM

## 2017-10-14 NOTE — MAU Note (Signed)
contractions since 0200

## 2017-10-15 LAB — RPR, QUANT+TP ABS (REFLEX)
Rapid Plasma Reagin, Quant: 1:1 {titer} — ABNORMAL HIGH
T Pallidum Abs: NEGATIVE

## 2017-10-15 LAB — RPR: RPR Ser Ql: REACTIVE — AB

## 2017-10-15 NOTE — Progress Notes (Signed)
Post Partum Day 1 Subjective: no complaints, up ad lib, voiding and tolerating PO  Objective: Blood pressure 112/66, pulse 80, temperature 98.4 F (36.9 C), temperature source Oral, resp. rate 16, height $RemoveBeforeD [id number]$5\' 5"known if currently breastfeeding.  Physical Exam:  General: alert, cooperative and no distress Lochia: appropriate Uterine Fundus: firm Incision: n/a DVT Evaluation: No evidence of DVT seen on physical exam. Negative Homan's sign. No cords or calf tenderness. No significant calf/ankle edema. SCDs on  Recent Labs    10/14/17 0650  HGB 12.4  HCT 34.6*    Assessment/Plan: Plan for discharge tomorrow, Breastfeeding and Lactation consult  Magnesium on until 10am.   LOS: 1 day   Tracy Archer 10/15/2017, 6:28 AM

## 2017-10-15 NOTE — Lactation Note (Signed)
This note was copied from a baby's chart. Lactation Consultation Note  Patient Name: Boy Ottie GlazierFuraha Bueno ZOXWR'UToday's Date: 10/15/2017 Reason for consult: Initial assessment;Other (Comment)([place]ili [employer] interpreter [phone] via I pad )  As LC entered the room mom holding baby and per mom baby last fed at 2:47 pm for 15 mins and had a wet and black stool. The feeding was comfortable and no pain. Fed her 1st and 2 nd baby 2 years each without problems.  LC reviewed supply and demand and encouraged mom to offer both breast at every feeding and if the baby is satisfied hold off on supplementing. Moms feeding preference is to breast / formula.  Per the RN Zenaida Deed( Cemona Miller ) caring for baby- latch score was 8 this am and baby was feeding well.  Per mom would like to sign up with WIC . Also will need a hand pump prior to discharge.  LC reviewed hand expressing and per mom has done it before/ and did not use a pump with 1st 2 babies. Mother informed of post-discharge support and given phone number to the lactation department, including services for phone call assistance; out-patient appointments; and breastfeeding support group. List of other breastfeeding resources in the community given in the handout. Encouraged mother to call for problems or concerns related to breastfeeding.    Maternal Data    Feeding Feeding Type: (per mom baby last fed at 2:47 p for 15 mins )  LATCH Score                   Interventions Interventions: Breast feeding basics reviewed  Lactation Tools Discussed/Used WIC Program: No(mom interested in signing up )   Consult Status Consult Status: Follow-up Date: 10/16/17 Follow-up type: In-patient    Matilde SprangMargaret Ann Enslie Sahota 10/15/2017, 4:40 PM

## 2017-10-15 NOTE — Plan of Care (Signed)
Patient is resting in bed at this time. Denies any pain or discomfort. Denies any headache, blurry vision, or epigastric pain. Patient is pleasant and cooperative. She ambulates from her bed to the bathroom without any difficulty.

## 2017-10-16 ENCOUNTER — Ambulatory Visit: Payer: Self-pay

## 2017-10-16 MED ORDER — IBUPROFEN 600 MG PO TABS: 600.0000 mg | ORAL_TABLET | Freq: Four times a day (QID) | ORAL | 2 refills | Status: DC

## 2017-10-16 MED ORDER — AMLODIPINE BESYLATE 5 MG PO TABS: 5.0000 mg | ORAL_TABLET | Freq: Every day | ORAL | 1 refills | Status: DC

## 2017-10-16 MED ORDER — DOCUSATE SODIUM 100 MG PO CAPS: 100.0000 mg | ORAL_CAPSULE | Freq: Two times a day (BID) | ORAL | 2 refills | Status: DC | PRN

## 2017-10-16 MED ORDER — AMLODIPINE BESYLATE 5 MG PO TABS: 5.0000 mg | ORAL_TABLET | Freq: Every day | ORAL | Status: DC

## 2017-10-16 NOTE — Discharge Summary (Signed)
OB Discharge Summary     Patient Name: Tracy Archer DOB: [birth year] MRN: [id number]  Date of admission: 10/14/2017 Delivering MD: Cam Hai D   Date of discharge: 10/16/2017  Admitting diagnosis: 38 wks labor Intrauterine pregnancy: [redacted]w[redacted]d     Secondary diagnosis:  Principal Problem:   Preeclampsia in postpartum period Active Problems:   Normal labor  Additional problems: Language barrier; speaks Swahili. [employer] interpreter used during hospital course.     Discharge diagnosis: Term Pregnancy Delivered and Preeclampsia (severe)                                                                                                Post partum procedures:Magnesium sulfate x 24 hours for eclampsia prophylaxis  Augmentation: None  Complications: None  Hospital course:  Onset of Labor With Vaginal Delivery     22 y.o. yo [id number] at [redacted]w[redacted]d was admitted in Active Labor on 10/14/2017. Patient had an uncomplicated labor course as follows:  Membrane Rupture Time/Date: 7:12 AM ,10/14/2017   Intrapartum Procedures: Episiotomy: None [1]                                         Lacerations:  None [1]  Patient had a precipitous vaginal delivery of a Viable infant. 10/14/2017  Information for the patient's newborn:  Najmah, Carradine [[id number]]  Delivery Method: Vag-Spont  Pateint was noted to have severe range BP after delivery; needed treatment with antihypertensives and was started on magnesium sulfate for eclampsia prophylaxis for 24 hours.  BPs decreased but she still has a few elevated values. She was started on Norvasc 5 mg; will have home BP checks this week. Otherwise, she had an uncomplicated postpartum course.  She is ambulating, tolerating a regular diet, passing flatus, and urinating well. Patient is discharged home in stable condition on 10/16/17.   Physical exam  Vitals:   10/15/17 1930 10/15/17 2304 10/16/17 0508 10/16/17 0748  BP: 112/71 119/78 127/77 (!) 143/99  Pulse: 74 82  68 64  Resp: 16 18 16 18   Temp: 97.7 F (36.5 C) 99 F (37.2 C) 98.4 F (36.9 C) 98.3 F (36.8 C)  TempSrc: Oral Oral Oral Oral  SpO2: 98% 94% 98% 98%  Weight:      Height:       General: alert, cooperative and no distress Lochia: appropriate Uterine Fundus: firm Incision: N/A DVT Evaluation: No evidence of DVT seen on physical exam. Negative Homan's sign. No cords or calf tenderness. No significant calf/ankle edema. Labs: Lab Results  Component Value Date   WBC 8.9 10/14/2017   HGB 12.4 10/14/2017   HCT 34.6 (L) 10/14/2017   MCV 84.4 10/14/2017   PLT 154 10/14/2017   CMP Latest Ref Rng & Units 10/14/2017  Glucose 65 - 99 mg/dL 84  BUN 6 - 20 mg/dL 10  Creatinine 1.30 - 8.65 mg/dL 7.84  Sodium 696 - 295 mmol/L 135  Potassium 3.5 - 5.1 mmol/L 3.9  Chloride 101 - 111 mmol/L 107  CO2 22 - 32 mmol/L 16(L)  Calcium 8.9 - 10.3 mg/dL 8.9  Total Protein 6.5 - 8.1 g/dL 6.6  Total Bilirubin 0.3 - 1.2 mg/dL 0.3  Alkaline Phos 38 - 126 U/L 416(H)  AST 15 - 41 U/L 21  ALT 14 - 54 U/L 15    Discharge instruction: per After Visit Summary and "Baby and Me Booklet".  After visit meds:  Allergies as of 10/16/2017   No Known Allergies     Medication List    TAKE these medications   amLODipine 5 MG tablet Commonly known as:  NORVASC Take 1 tablet (5 mg total) by mouth daily.   docusate sodium 100 MG capsule Commonly known as:  COLACE Take 1 capsule (100 mg total) by mouth 2 (two) times daily as needed for mild constipation or moderate constipation.   ibuprofen 600 MG tablet Commonly known as:  ADVIL,MOTRIN Take 1 tablet (600 mg total) by mouth every 6 (six) hours.   multivitamin-prenatal 27-0.8 MG Tabs tablet Take 1 tablet by mouth daily at 12 noon.       Diet: routine diet  Activity: Advance as tolerated. Pelvic rest for 6 weeks.   Outpatient follow up:4 weeks  Postpartum contraception: Depo Provera  Newborn Data: Live born female  Birth Weight: 7 lb 2.6 oz  (3250 g) APGAR: 8, 9  Newborn Delivery   Birth date/time:  10/14/2017 07:43:00 Delivery type:  Vaginal, Spontaneous     Baby Feeding: Breast Disposition:home with mother   10/16/2017 Jaynie CollinsUgonna Kaveh Kissinger, MD

## 2017-10-16 NOTE — Lactation Note (Signed)
This note was copied from a baby's chart. Lactation Consultation Note  Patient Name: Tracy Ottie GlazierFuraha Archer ZOXWR'UToday's Date: 10/16/2017   Visited with Mom on moment of discharge, baby 1252 hrs old.  Mom dressed, and baby in car seat.  Mom declined needing any assistance, stating breastfeeding is going well.    [name], [name] E 10/16/2017, 2:00 PM

## 2017-10-16 NOTE — Discharge Instructions (Signed)
Vaginal Delivery, Care After °Refer to this sheet in the next few weeks. These instructions provide you with information about caring for yourself after vaginal delivery. Your health care provider may also give you more specific instructions. Your treatment has been planned according to current medical practices, but problems sometimes occur. Call your health care provider if you have any problems or questions. °What can I expect after the procedure? °After vaginal delivery, it is common to have: °· Some bleeding from your vagina. °· Soreness in your abdomen, your vagina, and the area of skin between your vaginal opening and your anus (perineum). °· Pelvic cramps. °· Fatigue. ° °Follow these instructions at home: °Medicines °· Take over-the-counter and prescription medicines only as told by your health care provider. °· If you were prescribed an antibiotic medicine, take it as told by your health care provider. Do not stop taking the antibiotic until it is finished. °Driving ° °· Do not drive or operate heavy machinery while taking prescription pain medicine. °· Do not drive for 24 hours if you received a sedative. °Lifestyle °· Do not drink alcohol. This is especially important if you are breastfeeding or taking medicine to relieve pain. °· Do not use tobacco products, including cigarettes, chewing tobacco, or e-cigarettes. If you need help quitting, ask your health care provider. °Eating and drinking °· Drink at least 8 eight-ounce glasses of water every day unless you are told not to by your health care provider. If you choose to breastfeed your baby, you may need to drink more water than this. °· Eat high-fiber foods every day. These foods may help prevent or relieve constipation. High-fiber foods include: °? Whole grain cereals and breads. °? Brown rice. °? Beans. °? Fresh fruits and vegetables. °Activity °· Return to your normal activities as told by your health care provider. Ask your health care provider  what activities are safe for you. °· Rest as much as possible. Try to rest or take a nap when your baby is sleeping. °· Do not lift anything that is heavier than your baby or 10 lb (4.5 kg) until your health care provider says that it is safe. °· Talk with your health care provider about when you can engage in sexual activity. This may depend on your: °? Risk of infection. °? Rate of healing. °? Comfort and desire to engage in sexual activity. °Vaginal Care °· If you have an episiotomy or a vaginal tear, check the area every day for signs of infection. Check for: °? More redness, swelling, or pain. °? More fluid or blood. °? Warmth. °? Pus or a bad smell. °· Do not use tampons or douches until your health care provider says this is safe. °· Watch for any blood clots that may pass from your vagina. These may look like clumps of dark red, brown, or black discharge. °General instructions °· Keep your perineum clean and dry as told by your health care provider. °· Wear loose, comfortable clothing. °· Wipe from front to back when you use the toilet. °· Ask your health care provider if you can shower or take a bath. If you had an episiotomy or a perineal tear during labor and delivery, your health care provider may tell you not to take baths for a certain length of time. °· Wear a bra that supports your breasts and fits you well. °· If possible, have someone help you with household activities and help care for your baby for at least a few days after   you leave the hospital. °· Keep all follow-up visits for you and your baby as told by your health care provider. This is important. °Contact a health care provider if: °· You have: °? Vaginal discharge that has a bad smell. °? Difficulty urinating. °? Pain when urinating. °? A sudden increase or decrease in the frequency of your bowel movements. °? More redness, swelling, or pain around your episiotomy or vaginal tear. °? More fluid or blood coming from your episiotomy or  vaginal tear. °? Pus or a bad smell coming from your episiotomy or vaginal tear. °? A fever. °? A rash. °? Little or no interest in activities you used to enjoy. °? Questions about caring for yourself or your baby. °· Your episiotomy or vaginal tear feels warm to the touch. °· Your episiotomy or vaginal tear is separating or does not appear to be healing. °· Your breasts are painful, hard, or turn red. °· You feel unusually sad or worried. °· You feel nauseous or you vomit. °· You pass large blood clots from your vagina. If you pass a blood clot from your vagina, save it to show to your health care provider. Do not flush blood clots down the toilet without having your health care provider look at them. °· You urinate more than usual. °· You are dizzy or light-headed. °· You have not breastfed at all and you have not had a menstrual period for 12 weeks after delivery. °· You have stopped breastfeeding and you have not had a menstrual period for 12 weeks after you stopped breastfeeding. °Get help right away if: °· You have: °? Pain that does not go away or does not get better with medicine. °? Chest pain. °? Difficulty breathing. °? Blurred vision or spots in your vision. °? Thoughts about hurting yourself or your baby. °· You develop pain in your abdomen or in one of your legs. °· You develop a severe headache. °· You faint. °· You bleed from your vagina so much that you fill two sanitary pads in one hour. °This information is not intended to replace advice given to you by your health care provider. Make sure you discuss any questions you have with your health care provider. °Document Released: 06/10/2000 Document Revised: 11/25/2015 Document Reviewed: 06/28/2015 °Elsevier Interactive Patient Education © 2018 [employer] ° °

## 2017-10-18 LAB — BPAM RBC
Blood Product Expiration Date: 201905022359
Blood Product Expiration Date: 201905022359
Blood Product Expiration Date: 201905062359
Blood Product Expiration Date: 201905082359
Blood Product Expiration Date: 201905282359
Blood Product Expiration Date: 201905282359
ISSUE DATE / TIME: 201904210224
ISSUE DATE / TIME: 201904210533
ISSUE DATE / TIME: 201904212013
ISSUE DATE / TIME: 201904212036
Unit Type and Rh: 9500
Unit Type and Rh: 9500
Unit Type and Rh: 9500
Unit Type and Rh: 9500
Unit Type and Rh: 9500
Unit Type and Rh: 9500

## 2017-10-18 LAB — TYPE AND SCREEN
ABO/RH(D): O NEG
Antibody Screen: POSITIVE
Unit division: 0
Unit division: 0
Unit division: 0
Unit division: 0
Unit division: 0
Unit division: 0

## 2017-12-26 DIAGNOSIS — Z113 Encounter for screening for infections with a predominantly sexual mode of transmission: Secondary | ICD-10-CM | POA: Diagnosis not present

## 2017-12-26 DIAGNOSIS — Z0389 Encounter for observation for other suspected diseases and conditions ruled out: Secondary | ICD-10-CM | POA: Diagnosis not present

## 2017-12-26 DIAGNOSIS — Z3200 Encounter for pregnancy test, result unknown: Secondary | ICD-10-CM | POA: Diagnosis not present

## 2017-12-26 DIAGNOSIS — Z30013 Encounter for initial prescription of injectable contraceptive: Secondary | ICD-10-CM | POA: Diagnosis not present

## 2017-12-26 DIAGNOSIS — Z3009 Encounter for other general counseling and advice on contraception: Secondary | ICD-10-CM | POA: Diagnosis not present

## 2017-12-26 DIAGNOSIS — Z1388 Encounter for screening for disorder due to exposure to contaminants: Secondary | ICD-10-CM | POA: Diagnosis not present

## 2018-05-04 DIAGNOSIS — Z3009 Encounter for other general counseling and advice on contraception: Secondary | ICD-10-CM | POA: Diagnosis not present

## 2018-05-04 DIAGNOSIS — Z3200 Encounter for pregnancy test, result unknown: Secondary | ICD-10-CM | POA: Diagnosis not present

## 2018-05-04 DIAGNOSIS — Z3042 Encounter for surveillance of injectable contraceptive: Secondary | ICD-10-CM | POA: Diagnosis not present

## 2018-09-10 ENCOUNTER — Emergency Department (HOSPITAL_COMMUNITY): Payer: 59

## 2018-09-10 ENCOUNTER — Encounter (HOSPITAL_COMMUNITY): Payer: Self-pay | Admitting: Emergency Medicine

## 2018-09-10 ENCOUNTER — Emergency Department (HOSPITAL_COMMUNITY)
Admission: EM | Admit: 2018-09-10 | Discharge: 2018-09-10 | Disposition: A | Discharge status: Home / Self Care | Payer: 59 | Attending: Emergency Medicine | Admitting: Emergency Medicine

## 2018-09-10 ENCOUNTER — Other Ambulatory Visit: Payer: Self-pay

## 2018-09-10 DIAGNOSIS — J020 Streptococcal pharyngitis: Secondary | ICD-10-CM | POA: Diagnosis not present

## 2018-09-10 DIAGNOSIS — Z79899 Other long term (current) drug therapy: Secondary | ICD-10-CM | POA: Insufficient documentation

## 2018-09-10 DIAGNOSIS — R070 Pain in throat: Secondary | ICD-10-CM | POA: Diagnosis not present

## 2018-09-10 DIAGNOSIS — J029 Acute pharyngitis, unspecified: Secondary | ICD-10-CM | POA: Diagnosis present

## 2018-09-10 LAB — GROUP A STREP BY PCR: GROUP A STREP BY PCR: DETECTED — AB

## 2018-09-10 LAB — INFLUENZA PANEL BY PCR (TYPE A & B)
INFLAPCR: NEGATIVE
Influenza B By PCR: NEGATIVE

## 2018-09-10 MED ORDER — AMOXICILLIN 500 MG PO CAPS: 500.0000 mg | ORAL_CAPSULE | Freq: Two times a day (BID) | ORAL | 0 refills | Status: DC

## 2018-09-10 NOTE — ED Provider Notes (Signed)
[facility] EMERGENCY DEPARTMENT Provider Note   CSN: [id number] Arrival date & time: 09/10/18  1207    History   Chief Complaint Chief Complaint  Patient presents with  . Sore Throat    HPI Tracy Archer is a 23 y.o. female.     HPI   23 year old female presents with a four-day history of sore throat and cough.  She notes associated clear sputum production.  Patient denies any rhinorrhea, fevers, chills.  Patient denies any known sick contacts, flu exposure, COVID-19 exposure.  Patient denies any travel outside of [place] in the last month.  Patient denies any nausea, vomiting, chest pain, shortness of breath.  Past Medical History:  Diagnosis Date  . Biological false positive RPR test 03/12/2016   Quant was 1:1, negative confirmatory T.pallidum antibodies    Patient Active Problem List   Diagnosis Date Noted  . Normal labor 10/14/2017  . Preeclampsia in postpartum period 10/14/2017  . Biological false positive RPR test 03/12/2016    Past Surgical History:  Procedure Laterality Date  . NO PAST SURGERIES       OB History    Gravida  3   Para  3   Term  3   Preterm      AB      Living  2     SAB      TAB      Ectopic      Multiple  0   Live Births  1            Home Medications    Prior to Admission medications   Medication Sig Start Date End Date Taking? Authorizing Provider  amLODipine (NORVASC) 5 MG tablet Take 1 tablet (5 mg total) by mouth daily. 10/16/17   Anyanwu, Jethro Bastos, MD  docusate sodium (COLACE) 100 MG capsule Take 1 capsule (100 mg total) by mouth 2 (two) times daily as needed for mild constipation or moderate constipation. 10/16/17   Anyanwu, Jethro Bastos, MD  ibuprofen (ADVIL,MOTRIN) 600 MG tablet Take 1 tablet (600 mg total) by mouth every 6 (six) hours. 10/16/17   Anyanwu, Jethro Bastos, MD  Prenatal Vit-Fe Fumarate-FA (MULTIVITAMIN-PRENATAL) 27-0.8 MG TABS tablet Take 1 tablet by mouth daily at 12 noon.      [provider]    Family History No family history on file.  Social History Social History   Tobacco Use  . Smoking status: Never Smoker  . Smokeless tobacco: Never Used  Substance Use Topics  . Alcohol use: No  . Drug use: No     Allergies   Patient has no known allergies.   Review of Systems Review of Systems  Constitutional: Negative for chills and fever.  HENT: Positive for sore throat. Negative for rhinorrhea.   Respiratory: Positive for cough. Negative for shortness of breath.   Cardiovascular: Negative for chest pain.  Gastrointestinal: Negative for abdominal pain, nausea and vomiting.  Skin: Negative for rash and wound.     Physical Exam Updated Vital Signs BP 128/77 (BP Location: Right Arm)   Pulse 92   Temp 98.5 F (36.9 C) (Oral)   Resp 16   SpO2 98%   Physical Exam Vitals signs and nursing note reviewed.  Constitutional:      Appearance: She is well-developed.  HENT:     Head: Normocephalic and atraumatic.     Right Ear: Tympanic membrane normal.     Left Ear: Tympanic membrane normal.     Nose:  Nose normal.     Mouth/Throat:     Lips: Pink.     Mouth: Mucous membranes are moist.     Pharynx: Uvula midline. Posterior oropharyngeal erythema present. No oropharyngeal exudate or uvula swelling.     Tonsils: No tonsillar exudate or tonsillar abscesses.  Eyes:     Conjunctiva/sclera: Conjunctivae normal.  Neck:     Musculoskeletal: Neck supple.  Cardiovascular:     Rate and Rhythm: Normal rate and regular rhythm.     Heart sounds: Normal heart sounds. No murmur.  Pulmonary:     Effort: Pulmonary effort is normal. No respiratory distress.     Breath sounds: Normal breath sounds. No wheezing or rales.  Abdominal:     General: Bowel sounds are normal. There is no distension.     Palpations: Abdomen is soft.     Tenderness: There is no abdominal tenderness.  Musculoskeletal: Normal range of motion.        General: No tenderness or  deformity.  Skin:    General: Skin is warm and dry.     Findings: No erythema or rash.  Neurological:     Mental Status: She is alert and oriented to person, place, and time.  Psychiatric:        Behavior: Behavior normal.      ED Treatments / Results  Labs (all labs ordered are listed, but only abnormal results are displayed) Labs Reviewed  GROUP A STREP BY PCR  INFLUENZA PANEL BY PCR (TYPE A & B)    EKG None  Radiology Dg Chest 2 View  Result Date: 09/10/2018 CLINICAL DATA:  Sore throat for several days EXAM: CHEST - 2 VIEW COMPARISON:  None. FINDINGS: The heart size and mediastinal contours are within normal limits. Both lungs are clear. The visualized skeletal structures are unremarkable. IMPRESSION: No active cardiopulmonary disease. Electronically Signed   By: Alcide Clever M.D.   On: 09/10/2018 13:12    Procedures Procedures (including critical care time)  Medications Ordered in ED Medications - No data to display   Initial Impression / Assessment and Plan / ED Course  I have reviewed the triage vital signs and the nursing notes.  Pertinent labs & imaging results that were available during my care of the patient were reviewed by me and considered in my medical decision making (see chart for details).        Presented with sore throat and cough.  She denies any fevers.  She denies any known flu exposure or COVID-19 smoker.  She had no recent travel.  Patient able to talk in full sentences with no increased work of breathing or accessory muscle use.  Lungs clear to auscultation throughout.  Her posterior pharynx was mildly erythematous.  No evidence of peritonsillar or retropharyngeal abscess.  No evidence of Ludwick's.  Her strep test is positive.  She is offered IM medicine versus p.o.  Patient was p.o.  She was instructed to go medication as prescribed.  Her rapid flu is pending however anticipate that it is negative.  Her chest x-ray shows no evidence of  pneumothorax, pleural effusion, pneumonia.  She was given strict return precautions and expressed understanding.  Ready and stable for discharge.  Final Clinical Impressions(s) / ED Diagnoses   Final diagnoses:  None    ED Discharge Orders    None       Clayborne Artist, PA-C 09/10/18 1627    Margarita Grizzle, MD 09/14/18 1549

## 2018-09-10 NOTE — ED Notes (Signed)
Pt informed she needs to provide urine sample when possible. Pt verbalized understanding.

## 2018-09-10 NOTE — Discharge Instructions (Signed)
Take all amoxicillin as prescribed.  Follow-up with your primary care provider in 2 days for continued evaluation.  Return to the ED immediately for new or worsening symptoms or concerns, such as difficulty swallowing, difficulty eating, fevers, vomiting or any concerns at all.

## 2018-09-10 NOTE — ED Notes (Signed)
Please note: Pt's urine collected and located at "Main Lab" tray in green zone.

## 2018-09-10 NOTE — ED Triage Notes (Signed)
sorethroat since Friday, no runny nose but has a cough some mucus , no n/v/d

## 2018-09-12 ENCOUNTER — Emergency Department (HOSPITAL_COMMUNITY)
Admission: EM | Admit: 2018-09-12 | Discharge: 2018-09-12 | Disposition: A | Discharge status: Home / Self Care | Payer: 59 | Attending: Emergency Medicine | Admitting: Emergency Medicine

## 2018-09-12 ENCOUNTER — Encounter (HOSPITAL_COMMUNITY): Payer: Self-pay | Admitting: *Deleted

## 2018-09-12 DIAGNOSIS — Z7689 Persons encountering health services in other specified circumstances: Secondary | ICD-10-CM | POA: Insufficient documentation

## 2018-09-12 NOTE — ED Triage Notes (Signed)
Pt seen here Monday and her job requires a return to work note.Pt denies any problems.

## 2018-09-12 NOTE — ED Notes (Signed)
Patient verbalizes understanding of discharge instructions . Opportunity for questions and answers were provided . Armband removed by staff ,Pt discharged from ED. W/C  offered at D/C  and Declined W/C at D/C and was escorted to lobby by RN.  

## 2018-09-12 NOTE — ED Notes (Signed)
Pt returns today for a work note .

## 2018-09-16 NOTE — ED Provider Notes (Signed)
[facility] EMERGENCY DEPARTMENT Provider Note   CSN: [id number] Arrival date & time: 09/12/18  1702    History   Chief Complaint Chief Complaint  Patient presents with  . Letter for School/Work    HPI Denai Sayson is a 23 y.o. female.     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 Patient reports that she went to work earlier today and she was told that she will need a work note to return to work.  She had come into the ER on 316 with complaints of sore throat and cough.  At this time she reports that her sore throat has resolved and she is not coughing.  She also denies any fevers, chills.  Patient never had any exposures to patients with flu or COVID-19  Past Medical History:  Diagnosis Date  . Biological false positive RPR test 03/12/2016   Quant was 1:1, negative confirmatory T.pallidum antibodies    Patient Active Problem List   Diagnosis Date Noted  . Normal labor 10/14/2017  . Preeclampsia in postpartum period 10/14/2017  . Biological false positive RPR test 03/12/2016    Past Surgical History:  Procedure Laterality Date  . NO PAST SURGERIES       OB History    Gravida  3   Para  3   Term  3   Preterm      AB      Living  2     SAB      TAB      Ectopic      Multiple  0   Live Births  1            Home Medications    Prior to Admission medications   Medication Sig Start Date End Date Taking? Authorizing Provider  amLODipine (NORVASC) 5 MG tablet Take 1 tablet (5 mg total) by mouth daily. 10/16/17   Anyanwu, Jethro Bastos, MD  amoxicillin (AMOXIL) 500 MG capsule Take 1 capsule (500 mg total) by mouth 2 (two) times daily. 09/10/18   Kendrick, Caitlyn S, PA-C  docusate sodium (COLACE) 100 MG capsule Take 1 capsule (100 mg total) by mouth 2 (two) times daily as needed for mild constipation or moderate constipation. 10/16/17   Anyanwu, Jethro Bastos, MD  ibuprofen (ADVIL,MOTRIN) 600 MG tablet  Take 1 tablet (600 mg total) by mouth every 6 (six) hours. 10/16/17   Anyanwu, Jethro Bastos, MD  Prenatal Vit-Fe Fumarate-FA (MULTIVITAMIN-PRENATAL) 27-0.8 MG TABS tablet Take 1 tablet by mouth daily at 12 noon.     [provider]    Family History History reviewed. No pertinent family history.  Social History Social History   Tobacco Use  . Smoking status: Never Smoker  . Smokeless tobacco: Never Used  Substance Use Topics  . Alcohol use: No  . Drug use: No     Allergies   Patient has no known allergies.   Review of Systems Review of Systems  Constitutional: Negative for fever.  Respiratory: Negative for cough and shortness of breath.      Physical Exam Updated Vital Signs BP 108/71 (BP Location: Right Arm)   Pulse 78   Temp 98.1 F (36.7 C) (Oral)   Resp 16   SpO2 98%   Physical Exam Vitals signs and nursing note reviewed.  Constitutional:      Appearance: She is well-developed.  HENT:     Head: Normocephalic and atraumatic.  Neck:  Musculoskeletal: Normal range of motion and neck supple.  Cardiovascular:     Rate and Rhythm: Normal rate.  Pulmonary:     Effort: Pulmonary effort is normal.  Abdominal:     General: Bowel sounds are normal.  Skin:    General: Skin is warm and dry.  Neurological:     Mental Status: She is alert and oriented to person, place, and time.      ED Treatments / Results  Labs (all labs ordered are listed, but only abnormal results are displayed) Labs Reviewed - No data to display  EKG None  Radiology No results found.  Procedures Procedures (including critical care time)  Medications Ordered in ED Medications - No data to display   Initial Impression / Assessment and Plan / ED Course  I have reviewed the triage vital signs and the nursing notes.  Pertinent labs & imaging results that were available during my care of the patient were reviewed by me and considered in my medical decision making (see chart  for details).        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  She was seen in the ER couple of days ago for sore throat and cough.  She is asymptomatic but needs a work note to go back to work.  Translation service was utilized.  With no sick exposures, no fevers and no current lower respiratory symptoms we do not think she has covid 19.  Final Clinical Impressions(s) / ED Diagnoses   Final diagnoses:  Return to work evaluation    ED Discharge Orders    None       Derwood Kaplan, MD 09/16/18 [phone]

## 2020-04-28 DIAGNOSIS — Z3200 Encounter for pregnancy test, result unknown: Secondary | ICD-10-CM | POA: Diagnosis not present

## 2020-05-20 DIAGNOSIS — D573 Sickle-cell trait: Secondary | ICD-10-CM | POA: Diagnosis not present

## 2020-05-20 DIAGNOSIS — Z789 Other specified health status: Secondary | ICD-10-CM | POA: Diagnosis not present

## 2020-05-20 DIAGNOSIS — O9A319 Physical abuse complicating pregnancy, unspecified trimester: Secondary | ICD-10-CM | POA: Diagnosis not present

## 2020-05-20 DIAGNOSIS — O99810 Abnormal glucose complicating pregnancy: Secondary | ICD-10-CM | POA: Diagnosis not present

## 2020-05-20 DIAGNOSIS — O9A519 Psychological abuse complicating pregnancy, unspecified trimester: Secondary | ICD-10-CM | POA: Diagnosis not present

## 2020-05-20 DIAGNOSIS — Z1331 Encounter for screening for depression: Secondary | ICD-10-CM | POA: Diagnosis not present

## 2020-05-20 DIAGNOSIS — B373 Candidiasis of vulva and vagina: Secondary | ICD-10-CM | POA: Diagnosis not present

## 2020-05-20 DIAGNOSIS — O99210 Obesity complicating pregnancy, unspecified trimester: Secondary | ICD-10-CM | POA: Diagnosis not present

## 2020-05-20 DIAGNOSIS — Z3482 Encounter for supervision of other normal pregnancy, second trimester: Secondary | ICD-10-CM | POA: Diagnosis not present

## 2020-05-20 LAB — OB RESULTS CONSOLE HEPATITIS B SURFACE ANTIGEN: Hepatitis B Surface Ag: NEGATIVE

## 2020-05-20 LAB — OB RESULTS CONSOLE RPR: RPR: NONREACTIVE

## 2020-05-20 LAB — OB RESULTS CONSOLE RUBELLA ANTIBODY, IGM: Rubella: IMMUNE

## 2020-05-20 LAB — OB RESULTS CONSOLE GC/CHLAMYDIA
Chlamydia: NEGATIVE
Gonorrhea: NEGATIVE

## 2020-05-25 DIAGNOSIS — O99810 Abnormal glucose complicating pregnancy: Secondary | ICD-10-CM | POA: Diagnosis not present

## 2020-05-25 DIAGNOSIS — Z3482 Encounter for supervision of other normal pregnancy, second trimester: Secondary | ICD-10-CM | POA: Diagnosis not present

## 2020-05-25 DIAGNOSIS — O9A519 Psychological abuse complicating pregnancy, unspecified trimester: Secondary | ICD-10-CM | POA: Diagnosis not present

## 2020-05-25 DIAGNOSIS — B373 Candidiasis of vulva and vagina: Secondary | ICD-10-CM | POA: Diagnosis not present

## 2020-05-25 DIAGNOSIS — D573 Sickle-cell trait: Secondary | ICD-10-CM | POA: Diagnosis not present

## 2020-05-25 DIAGNOSIS — O99210 Obesity complicating pregnancy, unspecified trimester: Secondary | ICD-10-CM | POA: Diagnosis not present

## 2020-05-25 DIAGNOSIS — Z1331 Encounter for screening for depression: Secondary | ICD-10-CM | POA: Diagnosis not present

## 2020-05-25 DIAGNOSIS — Z8759 Personal history of other complications of pregnancy, childbirth and the puerperium: Secondary | ICD-10-CM | POA: Diagnosis not present

## 2020-05-25 DIAGNOSIS — Z789 Other specified health status: Secondary | ICD-10-CM | POA: Diagnosis not present

## 2020-05-25 DIAGNOSIS — O9A319 Physical abuse complicating pregnancy, unspecified trimester: Secondary | ICD-10-CM | POA: Diagnosis not present

## 2020-06-25 DIAGNOSIS — Z363 Encounter for antenatal screening for malformations: Secondary | ICD-10-CM | POA: Diagnosis not present

## 2020-06-25 DIAGNOSIS — D573 Sickle-cell trait: Secondary | ICD-10-CM | POA: Diagnosis not present

## 2020-06-25 DIAGNOSIS — O99212 Obesity complicating pregnancy, second trimester: Secondary | ICD-10-CM | POA: Diagnosis not present
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
## 2020-08-31 DIAGNOSIS — D573 Sickle-cell trait: Secondary | ICD-10-CM | POA: Diagnosis not present

## 2020-08-31 DIAGNOSIS — Z8759 Personal history of other complications of pregnancy, childbirth and the puerperium: Secondary | ICD-10-CM | POA: Diagnosis not present

## 2020-08-31 DIAGNOSIS — B373 Candidiasis of vulva and vagina: Secondary | ICD-10-CM | POA: Diagnosis not present

## 2020-08-31 DIAGNOSIS — O99210 Obesity complicating pregnancy, unspecified trimester: Secondary | ICD-10-CM | POA: Diagnosis not present

## 2020-08-31 DIAGNOSIS — O9A519 Psychological abuse complicating pregnancy, unspecified trimester: Secondary | ICD-10-CM | POA: Diagnosis not present

## 2020-08-31 DIAGNOSIS — Z789 Other specified health status: Secondary | ICD-10-CM | POA: Diagnosis not present

## 2020-08-31 DIAGNOSIS — Z23 Encounter for immunization: Secondary | ICD-10-CM | POA: Diagnosis not present

## 2020-08-31 DIAGNOSIS — Z1331 Encounter for screening for depression: Secondary | ICD-10-CM | POA: Diagnosis not present

## 2020-08-31 DIAGNOSIS — O9A319 Physical abuse complicating pregnancy, unspecified trimester: Secondary | ICD-10-CM | POA: Diagnosis not present

## 2020-08-31 DIAGNOSIS — O99810 Abnormal glucose complicating pregnancy: Secondary | ICD-10-CM | POA: Diagnosis not present

## 2020-08-31 DIAGNOSIS — Z3483 Encounter for supervision of other normal pregnancy, third trimester: Secondary | ICD-10-CM | POA: Diagnosis not present

## 2020-08-31 LAB — OB RESULTS CONSOLE HIV ANTIBODY (ROUTINE TESTING): HIV: NONREACTIVE

## 2020-09-14 DIAGNOSIS — Z3483 Encounter for supervision of other normal pregnancy, third trimester: Secondary | ICD-10-CM | POA: Diagnosis not present

## 2020-10-12 DIAGNOSIS — Z3483 Encounter for supervision of other normal pregnancy, third trimester: Secondary | ICD-10-CM | POA: Diagnosis not present

## 2020-10-12 DIAGNOSIS — B373 Candidiasis of vulva and vagina: Secondary | ICD-10-CM | POA: Diagnosis not present

## 2020-10-12 LAB — OB RESULTS CONSOLE GC/CHLAMYDIA
Chlamydia: POSITIVE
Gonorrhea: NEGATIVE

## 2020-10-19 DIAGNOSIS — O26849 Uterine size-date discrepancy, unspecified trimester: Secondary | ICD-10-CM | POA: Diagnosis not present

## 2020-10-29 DIAGNOSIS — Z3483 Encounter for supervision of other normal pregnancy, third trimester: Secondary | ICD-10-CM | POA: Diagnosis not present

## 2020-11-05 ENCOUNTER — Other Ambulatory Visit: Payer: Self-pay

## 2020-11-05 ENCOUNTER — Inpatient Hospital Stay (HOSPITAL_COMMUNITY)
Admission: AD | Admit: 2020-11-05 | Discharge: 2020-11-08 | DRG: 807 | Disposition: A | Discharge status: Home / Self Care | Payer: Medicaid Other | Attending: Obstetrics and Gynecology | Admitting: Obstetrics and Gynecology

## 2020-11-05 ENCOUNTER — Encounter (HOSPITAL_COMMUNITY): Payer: Self-pay | Admitting: Obstetrics and Gynecology

## 2020-11-05 DIAGNOSIS — Z20822 Contact with and (suspected) exposure to covid-19: Secondary | ICD-10-CM | POA: Diagnosis present

## 2020-11-05 DIAGNOSIS — O99824 Streptococcus B carrier state complicating childbirth: Secondary | ICD-10-CM | POA: Diagnosis present

## 2020-11-05 DIAGNOSIS — T7491XA Unspecified adult maltreatment, confirmed, initial encounter: Secondary | ICD-10-CM

## 2020-11-05 DIAGNOSIS — O164 Unspecified maternal hypertension, complicating childbirth: Secondary | ICD-10-CM | POA: Diagnosis present

## 2020-11-05 DIAGNOSIS — Z3A39 39 weeks gestation of pregnancy: Secondary | ICD-10-CM

## 2020-11-05 DIAGNOSIS — O479 False labor, unspecified: Secondary | ICD-10-CM | POA: Diagnosis present

## 2020-11-05 NOTE — MAU Note (Signed)
Ctxs since 1700. Denies LOF or VB. Good FM

## 2020-11-06 ENCOUNTER — Other Ambulatory Visit: Payer: Self-pay

## 2020-11-06 ENCOUNTER — Encounter (HOSPITAL_COMMUNITY): Payer: Self-pay | Admitting: Obstetrics and Gynecology

## 2020-11-06 DIAGNOSIS — O164 Unspecified maternal hypertension, complicating childbirth: Secondary | ICD-10-CM | POA: Diagnosis not present

## 2020-11-06 DIAGNOSIS — Z20822 Contact with and (suspected) exposure to covid-19: Secondary | ICD-10-CM | POA: Diagnosis not present

## 2020-11-06 DIAGNOSIS — O9A33 Physical abuse complicating the puerperium: Secondary | ICD-10-CM | POA: Diagnosis not present

## 2020-11-06 DIAGNOSIS — O99824 Streptococcus B carrier state complicating childbirth: Secondary | ICD-10-CM | POA: Diagnosis not present

## 2020-11-06 DIAGNOSIS — Z789 Other specified health status: Secondary | ICD-10-CM | POA: Diagnosis not present

## 2020-11-06 DIAGNOSIS — O479 False labor, unspecified: Secondary | ICD-10-CM | POA: Diagnosis present

## 2020-11-06 DIAGNOSIS — Z3A39 39 weeks gestation of pregnancy: Secondary | ICD-10-CM | POA: Diagnosis not present

## 2020-11-06 DIAGNOSIS — O26893 Other specified pregnancy related conditions, third trimester: Secondary | ICD-10-CM | POA: Diagnosis not present

## 2020-11-06 LAB — CBC
HCT: 34.1 % — ABNORMAL LOW (ref 36.0–46.0)
Hemoglobin: 11.4 g/dL — ABNORMAL LOW (ref 12.0–15.0)
MCH: 27.2 pg (ref 26.0–34.0)
MCHC: 33.4 g/dL (ref 30.0–36.0)
MCV: 81.4 fL (ref 80.0–100.0)
Platelets: 165 10*3/uL (ref 150–400)
RBC: 4.19 MIL/uL (ref 3.87–5.11)
RDW: 13.4 % (ref 11.5–15.5)
WBC: 7.5 10*3/uL (ref 4.0–10.5)
nRBC: 0 % (ref 0.0–0.2)

## 2020-11-06 LAB — TYPE AND SCREEN
ABO/RH(D): O NEG
Antibody Screen: NEGATIVE
Weak D: POSITIVE

## 2020-11-06 LAB — RESP PANEL BY RT-PCR (FLU A&B, COVID) ARPGX2
Influenza A by PCR: NEGATIVE
Influenza B by PCR: NEGATIVE
SARS Coronavirus 2 by RT PCR: NEGATIVE

## 2020-11-06 LAB — RPR: RPR Ser Ql: NONREACTIVE

## 2020-11-06 MED ORDER — AMLODIPINE BESYLATE 5 MG PO TABS
5.0000 mg | ORAL_TABLET | Freq: Every day | ORAL | Status: DC
  Administered 2020-11-06 – 2020-11-08 (×3): 5 mg via ORAL
  Filled 2020-11-06 (×3): qty 1

## 2020-11-06 MED ORDER — COCONUT OIL OIL: 1.0000 "application " | TOPICAL_OIL | Status: DC | PRN

## 2020-11-06 MED ORDER — SODIUM CHLORIDE 0.9 % IV SOLN
2.0000 g | Freq: Once | INTRAVENOUS | Status: AC
  Administered 2020-11-06: 2 g via INTRAVENOUS
  Filled 2020-11-06: qty 2000

## 2020-11-06 MED ORDER — SENNOSIDES-DOCUSATE SODIUM 8.6-50 MG PO TABS
2.0000 | ORAL_TABLET | ORAL | Status: DC
  Administered 2020-11-06 – 2020-11-08 (×3): 2 via ORAL
  Filled 2020-11-06 (×3): qty 2

## 2020-11-06 MED ORDER — IBUPROFEN 600 MG PO TABS
600.0000 mg | ORAL_TABLET | Freq: Four times a day (QID) | ORAL | Status: DC
  Administered 2020-11-06 – 2020-11-08 (×8): 600 mg via ORAL
  Filled 2020-11-06 (×9): qty 1

## 2020-11-06 MED ORDER — ACETAMINOPHEN 325 MG PO TABS
650.0000 mg | ORAL_TABLET | ORAL | Status: DC | PRN
  Administered 2020-11-06: 650 mg via ORAL
  Filled 2020-11-06: qty 2

## 2020-11-06 MED ORDER — SODIUM CHLORIDE 0.9 % IV SOLN: 1.0000 g | INTRAVENOUS | Status: DC

## 2020-11-06 MED ORDER — OXYTOCIN BOLUS FROM INFUSION
333.0000 mL | Freq: Once | INTRAVENOUS | Status: AC
  Administered 2020-11-06: 333 mL via INTRAVENOUS

## 2020-11-06 MED ORDER — FENTANYL CITRATE (PF) 100 MCG/2ML IJ SOLN
100.0000 ug | INTRAMUSCULAR | Status: DC | PRN
Start: 2020-11-06 — End: 2020-11-06
  Administered 2020-11-06: 100 ug via INTRAVENOUS

## 2020-11-06 MED ORDER — PRENATAL MULTIVITAMIN CH
1.0000 | ORAL_TABLET | Freq: Every day | ORAL | Status: DC
  Administered 2020-11-06 – 2020-11-08 (×3): 1 via ORAL
  Filled 2020-11-06 (×4): qty 1

## 2020-11-06 MED ORDER — PRENATAL 27-0.8 MG PO TABS: 1.0000 | ORAL_TABLET | Freq: Every day | ORAL | Status: DC

## 2020-11-06 MED ORDER — WITCH HAZEL-GLYCERIN EX PADS: 1.0000 "application " | MEDICATED_PAD | CUTANEOUS | Status: DC | PRN

## 2020-11-06 MED ORDER — ONDANSETRON HCL 4 MG PO TABS: 4.0000 mg | ORAL_TABLET | ORAL | Status: DC | PRN

## 2020-11-06 MED ORDER — DIPHENHYDRAMINE HCL 25 MG PO CAPS: 25.0000 mg | ORAL_CAPSULE | Freq: Four times a day (QID) | ORAL | Status: DC | PRN

## 2020-11-06 MED ORDER — LIDOCAINE HCL (PF) 1 % IJ SOLN: 30.0000 mL | INTRAMUSCULAR | Status: DC | PRN

## 2020-11-06 MED ORDER — ONDANSETRON HCL 4 MG/2ML IJ SOLN: 4.0000 mg | Freq: Four times a day (QID) | INTRAMUSCULAR | Status: DC | PRN

## 2020-11-06 MED ORDER — OXYCODONE-ACETAMINOPHEN 5-325 MG PO TABS: 1.0000 | ORAL_TABLET | ORAL | Status: DC | PRN

## 2020-11-06 MED ORDER — TETANUS-DIPHTH-ACELL PERTUSSIS 5-2.5-18.5 LF-MCG/0.5 IM SUSY: 0.5000 mL | PREFILLED_SYRINGE | Freq: Once | INTRAMUSCULAR | Status: DC

## 2020-11-06 MED ORDER — ZOLPIDEM TARTRATE 5 MG PO TABS: 5.0000 mg | ORAL_TABLET | Freq: Every evening | ORAL | Status: DC | PRN

## 2020-11-06 MED ORDER — OXYCODONE-ACETAMINOPHEN 5-325 MG PO TABS: 2.0000 | ORAL_TABLET | ORAL | Status: DC | PRN

## 2020-11-06 MED ORDER — ONDANSETRON HCL 4 MG/2ML IJ SOLN: 4.0000 mg | INTRAMUSCULAR | Status: DC | PRN

## 2020-11-06 MED ORDER — FENTANYL CITRATE (PF) 100 MCG/2ML IJ SOLN
INTRAMUSCULAR | Status: AC
  Filled 2020-11-06: qty 2

## 2020-11-06 MED ORDER — LACTATED RINGERS IV SOLN: 500.0000 mL | INTRAVENOUS | Status: DC | PRN

## 2020-11-06 MED ORDER — ACETAMINOPHEN 325 MG PO TABS: 650.0000 mg | ORAL_TABLET | ORAL | Status: DC | PRN

## 2020-11-06 MED ORDER — DIBUCAINE (PERIANAL) 1 % EX OINT: 1.0000 "application " | TOPICAL_OINTMENT | CUTANEOUS | Status: DC | PRN

## 2020-11-06 MED ORDER — LACTATED RINGERS IV SOLN: INTRAVENOUS | Status: DC

## 2020-11-06 MED ORDER — SIMETHICONE 80 MG PO CHEW: 80.0000 mg | CHEWABLE_TABLET | ORAL | Status: DC | PRN

## 2020-11-06 MED ORDER — OXYTOCIN-SODIUM CHLORIDE 30-0.9 UT/500ML-% IV SOLN
2.5000 [IU]/h | INTRAVENOUS | Status: DC
  Filled 2020-11-06: qty 500

## 2020-11-06 MED ORDER — BENZOCAINE-MENTHOL 20-0.5 % EX AERO: 1.0000 "application " | INHALATION_SPRAY | CUTANEOUS | Status: DC | PRN

## 2020-11-06 MED ORDER — SOD CITRATE-CITRIC ACID 500-334 MG/5ML PO SOLN: 30.0000 mL | ORAL | Status: DC | PRN
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
## 2020-11-06 NOTE — Plan of Care (Signed)
Mayte Diers, RN 
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
## 2020-11-06 NOTE — Lactation Note (Signed)
This note was copied from a baby's chart. Lactation Consultation Note  Patient Name: Tracy Archer [id number] Date: 11/06/2020 Reason for consult: L&D Initial assessment Age:26 hours  Mom was seen in her L&D room. Mobile interpreter, Gaspar Garbe," ID# [phone] [phone]), used for the consult. Mom is a P4 & she nursed each previous child for 2 years.   When I entered room, infant was actively cueing. Per Mom, infant had not fed, yet. I asked Mom if she wanted to feed now. She replied that she had no milk, yet, but she would try. Infant latched with ease & Mom was comfortable with latch.   I explained via interpreter that she does have colostrum at this time. I showed her colostrum on her nipple by doing hand expression (with her permission obtained first).   Mom said she had no questions for me.  Lurline Hare [facility] 11/06/2020, 9:04 AM

## 2020-11-07 DIAGNOSIS — Z789 Other specified health status: Secondary | ICD-10-CM | POA: Diagnosis not present

## 2020-11-07 NOTE — Progress Notes (Signed)
Post Partum Day 1 Subjective: Tracy Archer  is a 25 y.o. [id number] s/p SVD at [redacted]w[redacted]d.  She reports she is doing well. No acute events overnight. She denies any problems with ambulating, voiding or po intake. Denies nausea or vomiting.  Pain is well controlled on ibuprofen.  Lochia is moderate and improving. Breastfeeding without difficulty.  Objective: Blood pressure 103/63, pulse 74, temperature 98.2 F (36.8 C), temperature source Oral, resp. rate 17, height 5\' 4"  (1.626 m), weight 95.7 kg, SpO2 100 %, currently breastfeeding.  Physical Exam:  General: alert, cooperative, fatigued and no distress Lochia: appropriate Uterine Fundus: firm, midline @U  Incision: n/a DVT Evaluation: No evidence of DVT seen on physical exam. Negative Homan's sign. No cords or calf tenderness. No significant calf/ankle edema.  Recent Labs    11/06/20 0230  HGB 11.4*  HCT 34.1*    Assessment/Plan: Plan for discharge tomorrow, Breastfeeding, Social Work consult, Circumcision prior to discharge and Contraception unsure  healthcare - [employer] [place], Research officer, political party used for entire visit   Patient desires circumcision for her female infant.  Circumcision procedure details discussed, risks and benefits of procedure were also discussed.  These include but are not limited to: Benefits of circumcision in men include reduction in the rates of urinary tract infection (UTI), penile cancer, some sexually transmitted infections, penile inflammatory and retractile disorders, as well as easier hygiene.  Risks include bleeding , infection, injury of glans which may lead to penile deformity or urinary tract issues, unsatisfactory cosmetic appearance and other potential complications related to the procedure.  It was emphasized that this is an elective procedure.  Patient wants to proceed with circumcision; written informed consent obtained.  Circumcision to be done pending  pediatric evaluation of infant. Routine circumcision and post circumcision care ordered for the infant.    LOS: 1 day   Aglantzia (Aglangia), CNM 11/07/2020, 9:14 AM

## 2020-11-07 NOTE — Progress Notes (Signed)
Rn used inter per  [employer] [id number]@ 2100

## 2020-11-08 DIAGNOSIS — O9A33 Physical abuse complicating the puerperium: Secondary | ICD-10-CM | POA: Diagnosis not present

## 2020-11-08 DIAGNOSIS — T7491XA Unspecified adult maltreatment, confirmed, initial encounter: Secondary | ICD-10-CM

## 2020-11-08 MED ORDER — ACETAMINOPHEN 325 MG PO TABS: 650.0000 mg | ORAL_TABLET | ORAL | 0 refills | Status: AC | PRN

## 2020-11-08 MED ORDER — IBUPROFEN 600 MG PO TABS: 600.0000 mg | ORAL_TABLET | Freq: Four times a day (QID) | ORAL | 0 refills | Status: DC

## 2020-11-08 MED ORDER — RHO D IMMUNE GLOBULIN 1500 UNIT/2ML IJ SOSY
300.0000 ug | PREFILLED_SYRINGE | Freq: Once | INTRAMUSCULAR | Status: DC
  Filled 2020-11-08: qty 2

## 2020-11-08 NOTE — Progress Notes (Signed)
Rn used interprer @ [phone]
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
## 2020-11-09 ENCOUNTER — Telehealth: Payer: Self-pay

## 2020-11-09 NOTE — Telephone Encounter (Signed)
Interpretor Needed   Transition Care Management Unsuccessful Follow-up Telephone Call  Date of discharge and from where:  11/08/2020 from [facility]  Attempts:  1st Attempt  Reason for unsuccessful TCM follow-up call:  Unable to reach patient

## 2020-11-10 NOTE — Telephone Encounter (Signed)
Interpreter ID: [id number]  Transition Care Management Unsuccessful Follow-up Telephone Call  Date of discharge and from where:  11/08/2020 from [facility]  Attempts:  2nd Attempt  Reason for unsuccessful TCM follow-up call:  Unable to leave message

## 2020-11-11 NOTE — Telephone Encounter (Signed)
Transition Care Management Unsuccessful Follow-up Telephone Call  Date of discharge and from where:  11/08/2020 from [facility]  Attempts:  3rd Attempt  Reason for unsuccessful TCM follow-up call:  Unable to reach patient

## 2022-05-05 ENCOUNTER — Emergency Department (HOSPITAL_COMMUNITY): Payer: Medicaid Other

## 2022-05-05 ENCOUNTER — Other Ambulatory Visit: Payer: Self-pay

## 2022-05-05 ENCOUNTER — Emergency Department (HOSPITAL_COMMUNITY)
Admission: EM | Admit: 2022-05-05 | Discharge: 2022-05-05 | Disposition: A | Discharge status: Home / Self Care | Payer: Medicaid Other | Attending: Emergency Medicine | Admitting: Emergency Medicine

## 2022-05-05 ENCOUNTER — Emergency Department (HOSPITAL_BASED_OUTPATIENT_CLINIC_OR_DEPARTMENT_OTHER): Admit: 2022-05-05 | Discharge: 2022-05-05 | Disposition: A | Discharge status: Home / Self Care | Payer: Medicaid Other

## 2022-05-05 ENCOUNTER — Encounter (HOSPITAL_COMMUNITY): Payer: Self-pay

## 2022-05-05 DIAGNOSIS — N3001 Acute cystitis with hematuria: Secondary | ICD-10-CM | POA: Diagnosis not present

## 2022-05-05 DIAGNOSIS — Z20822 Contact with and (suspected) exposure to covid-19: Secondary | ICD-10-CM | POA: Diagnosis not present

## 2022-05-05 DIAGNOSIS — O26891 Other specified pregnancy related conditions, first trimester: Secondary | ICD-10-CM | POA: Diagnosis present

## 2022-05-05 DIAGNOSIS — N9489 Other specified conditions associated with female genital organs and menstrual cycle: Secondary | ICD-10-CM | POA: Insufficient documentation

## 2022-05-05 DIAGNOSIS — O2311 Infections of bladder in pregnancy, first trimester: Secondary | ICD-10-CM | POA: Diagnosis not present

## 2022-05-05 DIAGNOSIS — Z3A08 8 weeks gestation of pregnancy: Secondary | ICD-10-CM | POA: Insufficient documentation

## 2022-05-05 DIAGNOSIS — Z3A01 Less than 8 weeks gestation of pregnancy: Secondary | ICD-10-CM | POA: Diagnosis not present

## 2022-05-05 DIAGNOSIS — R609 Edema, unspecified: Secondary | ICD-10-CM

## 2022-05-05 LAB — CBC WITH DIFFERENTIAL/PLATELET
Abs Immature Granulocytes: 0.01 10*3/uL (ref 0.00–0.07)
Basophils Absolute: 0 10*3/uL (ref 0.0–0.1)
Basophils Relative: 0 %
Eosinophils Absolute: 0 10*3/uL (ref 0.0–0.5)
Eosinophils Relative: 0 %
HCT: 34.5 % — ABNORMAL LOW (ref 36.0–46.0)
Hemoglobin: 12.3 g/dL (ref 12.0–15.0)
Immature Granulocytes: 0 %
Lymphocytes Relative: 20 %
Lymphs Abs: 1.8 10*3/uL (ref 0.7–4.0)
MCH: 28.2 pg (ref 26.0–34.0)
MCHC: 35.7 g/dL (ref 30.0–36.0)
MCV: 79.1 fL — ABNORMAL LOW (ref 80.0–100.0)
Monocytes Absolute: 1.3 10*3/uL — ABNORMAL HIGH (ref 0.1–1.0)
Monocytes Relative: 15 %
Neutro Abs: 5.7 10*3/uL (ref 1.7–7.7)
Neutrophils Relative %: 65 %
Platelets: 195 10*3/uL (ref 150–400)
RBC: 4.36 MIL/uL (ref 3.87–5.11)
RDW: 13.6 % (ref 11.5–15.5)
WBC: 8.9 10*3/uL (ref 4.0–10.5)
nRBC: 0 % (ref 0.0–0.2)

## 2022-05-05 LAB — URINALYSIS, ROUTINE W REFLEX MICROSCOPIC
Bilirubin Urine: NEGATIVE
Glucose, UA: NEGATIVE mg/dL
Ketones, ur: 20 mg/dL — AB
Nitrite: POSITIVE — AB
Protein, ur: 30 mg/dL — AB
Specific Gravity, Urine: 1.016 (ref 1.005–1.030)
pH: 5 (ref 5.0–8.0)

## 2022-05-05 LAB — COMPREHENSIVE METABOLIC PANEL
ALT: 20 U/L (ref 0–44)
AST: 21 U/L (ref 15–41)
Albumin: 3.5 g/dL (ref 3.5–5.0)
Alkaline Phosphatase: 49 U/L (ref 38–126)
Anion gap: 14 (ref 5–15)
BUN: <5 mg/dL — ABNORMAL LOW (ref 6–20)
CO2: 21 mmol/L — ABNORMAL LOW (ref 22–32)
Calcium: 9.4 mg/dL (ref 8.9–10.3)
Chloride: 99 mmol/L (ref 98–111)
Creatinine, Ser: 0.65 mg/dL (ref 0.44–1.00)
GFR, Estimated: >60 mL/min (ref >60)
Glucose, Bld: 105 mg/dL — ABNORMAL HIGH (ref 70–99)
Potassium: 3.4 mmol/L — ABNORMAL LOW (ref 3.5–5.1)
Sodium: 134 mmol/L — ABNORMAL LOW (ref 135–145)
Total Bilirubin: 0.4 mg/dL (ref 0.3–1.2)
Total Protein: 7.9 g/dL (ref 6.5–8.1)

## 2022-05-05 LAB — RESP PANEL BY RT-PCR (FLU A&B, COVID) ARPGX2
Influenza A by PCR: NEGATIVE
Influenza B by PCR: NEGATIVE
SARS Coronavirus 2 by RT PCR: NEGATIVE

## 2022-05-05 LAB — D-DIMER, QUANTITATIVE: D-Dimer, Quant: 1.89 ug/mL-FEU — ABNORMAL HIGH (ref 0.00–0.50)

## 2022-05-05 LAB — PREGNANCY, URINE: Preg Test, Ur: POSITIVE — AB

## 2022-05-05 LAB — TROPONIN I (HIGH SENSITIVITY)
Troponin I (High Sensitivity): 6 ng/L (ref <18)
Troponin I (High Sensitivity): 3 ng/L (ref <18)

## 2022-05-05 LAB — HIV ANTIBODY (ROUTINE TESTING W REFLEX): HIV Screen 4th Generation wRfx: NONREACTIVE

## 2022-05-05 LAB — LIPASE, BLOOD: Lipase: 26 U/L (ref 11–51)

## 2022-05-05 LAB — HCG, QUANTITATIVE, PREGNANCY: hCG, Beta Chain, Quant, S: 130993 m[IU]/mL — ABNORMAL HIGH (ref <5)

## 2022-05-05 MED ORDER — PRENATAL VITAMIN 27-0.8 MG PO TABS: 1.0000 | ORAL_TABLET | Freq: Every day | ORAL | 0 refills | Status: DC

## 2022-05-05 MED ORDER — SODIUM CHLORIDE 0.9 % IV SOLN
1.0000 g | Freq: Once | INTRAVENOUS | Status: AC
  Administered 2022-05-05: 1 g via INTRAVENOUS
  Filled 2022-05-05: qty 10

## 2022-05-05 MED ORDER — CEPHALEXIN 500 MG PO CAPS: 500.0000 mg | ORAL_CAPSULE | Freq: Four times a day (QID) | ORAL | 0 refills | Status: DC

## 2022-05-05 NOTE — ED Triage Notes (Signed)
Pt came in POV for Rt sided abd pain, hip pain & lower back pain that started Monday (05/02/22). No n/v, fever, runny nose, or CP. Pt does endorse pain in her Rt ribs when she breathes in deep. A/Ox4

## 2022-05-05 NOTE — ED Notes (Signed)
Patient transported to Ultrasound 

## 2022-05-05 NOTE — ED Notes (Signed)
Patient denies pain and is resting comfortably.  

## 2022-05-05 NOTE — ED Notes (Signed)
Vascular at bedside at this time.

## 2022-05-05 NOTE — ED Provider Triage Note (Signed)
Emergency Medicine Provider Triage Evaluation Note  Tracy Archer , a 26 y.o. female  was evaluated in triage.  Pt has a myriad of complaints.  Patient states the symptoms began this past Monday with congestion, diffuse myalgias.  She notes subsequently that she is experienced right-sided chest pain that is worsened with taking a deep breath as well as right hip pain and right middle back pain.  Patient denies fever, shortness of breath, abdominal pain, nausea, vomiting, urinary/vaginal symptoms, change in bowel habits.  He is taking no medication for this.  Denies saddle anesthesia, bowel/bladder dysfunction, weakness/sensory deficits in lower extremities, history of IV drug use.  Review of Systems  Positive:  Negative: See above  Physical Exam  BP 107/83 (BP Location: Left Arm)   Pulse 95   Temp 98.6 F (37 C) (Oral)   Resp 17   SpO2 99%  Gen:   Awake, no distress   Resp:  Normal effort  MSK:   Moves extremities without difficulty  Other:  No tenderness to palpation of the abdomen.  No midline tenderness of cervical, thoracic, lumbar spine.  No paraspinal tenderness bilaterally in the lumbar or thoracic region.  Medical Decision Making  Medically screening exam initiated at 3:46 PM.  Appropriate orders placed.  Tracy Archer was informed that the remainder of the evaluation will be completed by another provider, this initial triage assessment does not replace that evaluation, and the importance of remaining in the ED until their evaluation is complete.     Wilnette Kales, [place] 05/05/22 [phone]

## 2022-05-05 NOTE — Discharge Instructions (Signed)
You are 8 weeks and 2 days pregnant.  You also have a urinary tract infection.  Please take antibiotic as prescribed.  Take prenatal vitamin and follow-up with your OB/GYN for further care.

## 2022-05-05 NOTE — ED Notes (Signed)
Phlebotomy notified to draw pt labs

## 2022-05-05 NOTE — Progress Notes (Signed)
Bilateral lower extremity venous duplex has been completed. Preliminary results can be found in CV Proc through chart review.  Results were given to Fayrene Helper PA.  05/05/22 7:13 PM Olen Cordial RVT
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
## 2022-05-06 LAB — RPR
RPR Ser Ql: REACTIVE — AB
RPR Titer: 1:2 {titer}

## 2022-05-06 LAB — GC/CHLAMYDIA PROBE AMP (~~LOC~~) NOT AT ARMC
Chlamydia: NEGATIVE
Comment: NEGATIVE
Comment: NORMAL
Neisseria Gonorrhea: NEGATIVE

## 2022-05-07 LAB — URINE CULTURE: Culture: >=100000 — AB

## 2022-05-08 ENCOUNTER — Telehealth (HOSPITAL_BASED_OUTPATIENT_CLINIC_OR_DEPARTMENT_OTHER): Payer: Self-pay | Admitting: *Deleted

## 2022-05-08 LAB — T.PALLIDUM AB, TOTAL: T Pallidum Abs: NONREACTIVE

## 2022-05-08 NOTE — Telephone Encounter (Signed)
Post ED Visit - Positive Culture Follow-up  Culture report reviewed by antimicrobial stewardship pharmacist: [facility] []  , Pharm.D. []  Enzo Bi, Pharm.D., BCPS AQ-ID []  , Pharm.D., BCPS []  Celedonio Miyamoto, Pharm.D., BCPS []  [place], Garvin Fila.D., BCPS, AAHIVP []  , Pharm.D., BCPS, AAHIVP []  Georgina Pillion, PharmD, BCPS []  , PharmD, BCPS []  [place], PharmD, BCPS []  [address], PharmD []  , PharmD, BCPS [x]  Estella Husk, PharmD  Pharmacy Team []  Lysle Pearl, PharmD []  , PharmD []  Phillips Climes, PharmD []  , Rph []  Agapito Games) , PharmD []  Verlan Friends, PharmD []  , PharmD []  Mervyn Gay, PharmD []  , PharmD []  Domingo Sep, PharmD []  [facility], PharmD []  , PharmD []  Len Childs, PharmD   Positive urine culture Treated with Cephalexin, organism sensitive to the same and no further patient follow-up is required at this time.  05/08/2022, 10:27 AM

## 2022-05-24 DIAGNOSIS — Z3200 Encounter for pregnancy test, result unknown: Secondary | ICD-10-CM | POA: Diagnosis not present

## 2022-05-24 DIAGNOSIS — Z3009 Encounter for other general counseling and advice on contraception: Secondary | ICD-10-CM | POA: Diagnosis not present

## 2022-06-27 NOTE — L&D Delivery Note (Signed)
OB/GYN Faculty Practice Delivery Note  Tracy Archer is a 26 y.o. [id number] s/p VD at [redacted]w[redacted]d. She was admitted for SOL and elevated BP readings.   ROM: 0h 73m with clear fluid GBS Status:  Positive/-- (05/20 0000) Maximum Maternal Temperature: 97.6  Labor Progress: Initial SVE: 4/60/-3. She then progressed to complete.   Delivery Date/Time: 12/13/2022 @0655  Delivery: Called to room and patient was complete and pushing. Head delivered OA. Nuchal cord x2 present. Shoulder and body delivered in usual fashion. Infant with spontaneous cry, placed on mother's abdomen, dried and stimulated. Cord clamped x 2 after 1-minute delay, and cut by this provider. Cord blood drawn. Placenta delivered spontaneously with gentle cord traction. Fundus firm with massage and Pitocin. Labia, perineum, vagina, and cervix inspected. There were hemostatic vaginal abrasions.   Baby Weight: pending  Placenta: 3 vessel, intact. Sent to L&D Complications: None Lacerations: Vaginal abrasions EBL: 100 mL Analgesia: Epidural   Infant:  APGAR (1 MIN):  9 APGAR (5 MINS): 9   Areil Ottey Autry-Lott, DO OB Fellow, Faculty [facility] [employer] 12/13/2022, 7:15 AM

## 2022-10-18 DIAGNOSIS — Z3200 Encounter for pregnancy test, result unknown: Secondary | ICD-10-CM | POA: Diagnosis not present

## 2022-11-07 DIAGNOSIS — O0933 Supervision of pregnancy with insufficient antenatal care, third trimester: Secondary | ICD-10-CM | POA: Diagnosis not present

## 2022-11-07 DIAGNOSIS — O99210 Obesity complicating pregnancy, unspecified trimester: Secondary | ICD-10-CM | POA: Diagnosis not present

## 2022-11-07 DIAGNOSIS — Z3402 Encounter for supervision of normal first pregnancy, second trimester: Secondary | ICD-10-CM | POA: Diagnosis not present

## 2022-11-07 DIAGNOSIS — D573 Sickle-cell trait: Secondary | ICD-10-CM | POA: Diagnosis not present

## 2022-11-07 DIAGNOSIS — Z01419 Encounter for gynecological examination (general) (routine) without abnormal findings: Secondary | ICD-10-CM | POA: Diagnosis not present

## 2022-11-07 DIAGNOSIS — Z131 Encounter for screening for diabetes mellitus: Secondary | ICD-10-CM | POA: Diagnosis not present

## 2022-11-07 DIAGNOSIS — Z3483 Encounter for supervision of other normal pregnancy, third trimester: Secondary | ICD-10-CM | POA: Diagnosis not present

## 2022-11-07 DIAGNOSIS — Z114 Encounter for screening for human immunodeficiency virus [HIV]: Secondary | ICD-10-CM | POA: Diagnosis not present

## 2022-11-07 DIAGNOSIS — R824 Acetonuria: Secondary | ICD-10-CM | POA: Diagnosis not present

## 2022-11-07 DIAGNOSIS — Z23 Encounter for immunization: Secondary | ICD-10-CM | POA: Diagnosis not present

## 2022-11-07 DIAGNOSIS — Z31430 Encounter of female for testing for genetic disease carrier status for procreative management: Secondary | ICD-10-CM | POA: Diagnosis not present

## 2022-11-07 LAB — OB RESULTS CONSOLE RPR: RPR: NONREACTIVE

## 2022-11-07 LAB — OB RESULTS CONSOLE ANTIBODY SCREEN: Antibody Screen: NEGATIVE

## 2022-11-07 LAB — HEPATITIS C ANTIBODY: HCV Ab: NEGATIVE

## 2022-11-07 LAB — OB RESULTS CONSOLE HEPATITIS B SURFACE ANTIGEN: Hepatitis B Surface Ag: NEGATIVE

## 2022-11-07 LAB — OB RESULTS CONSOLE RUBELLA ANTIBODY, IGM: Rubella: IMMUNE

## 2022-11-07 LAB — OB RESULTS CONSOLE HIV ANTIBODY (ROUTINE TESTING): HIV: NONREACTIVE

## 2022-11-07 LAB — OB RESULTS CONSOLE GC/CHLAMYDIA
Chlamydia: NEGATIVE
Neisseria Gonorrhea: NEGATIVE

## 2022-11-14 DIAGNOSIS — R824 Acetonuria: Secondary | ICD-10-CM | POA: Diagnosis not present

## 2022-11-14 DIAGNOSIS — Z23 Encounter for immunization: Secondary | ICD-10-CM | POA: Diagnosis not present

## 2022-11-14 DIAGNOSIS — Z114 Encounter for screening for human immunodeficiency virus [HIV]: Secondary | ICD-10-CM | POA: Diagnosis not present

## 2022-11-14 DIAGNOSIS — O26849 Uterine size-date discrepancy, unspecified trimester: Secondary | ICD-10-CM | POA: Diagnosis not present

## 2022-11-14 DIAGNOSIS — O99210 Obesity complicating pregnancy, unspecified trimester: Secondary | ICD-10-CM | POA: Diagnosis not present

## 2022-11-14 DIAGNOSIS — D573 Sickle-cell trait: Secondary | ICD-10-CM | POA: Diagnosis not present

## 2022-11-14 DIAGNOSIS — O0933 Supervision of pregnancy with insufficient antenatal care, third trimester: Secondary | ICD-10-CM | POA: Diagnosis not present

## 2022-11-14 DIAGNOSIS — Z8279 Family history of other congenital malformations, deformations and chromosomal abnormalities: Secondary | ICD-10-CM | POA: Diagnosis not present

## 2022-11-14 DIAGNOSIS — Z3483 Encounter for supervision of other normal pregnancy, third trimester: Secondary | ICD-10-CM | POA: Diagnosis not present

## 2022-11-14 DIAGNOSIS — Z131 Encounter for screening for diabetes mellitus: Secondary | ICD-10-CM | POA: Diagnosis not present

## 2022-11-14 DIAGNOSIS — O99212 Obesity complicating pregnancy, second trimester: Secondary | ICD-10-CM | POA: Diagnosis not present

## 2022-11-14 LAB — OB RESULTS CONSOLE GBS: GBS: POSITIVE

## 2022-11-29 DIAGNOSIS — Z131 Encounter for screening for diabetes mellitus: Secondary | ICD-10-CM | POA: Diagnosis not present

## 2022-11-29 DIAGNOSIS — Z3483 Encounter for supervision of other normal pregnancy, third trimester: Secondary | ICD-10-CM | POA: Diagnosis not present

## 2022-11-29 DIAGNOSIS — R824 Acetonuria: Secondary | ICD-10-CM | POA: Diagnosis not present

## 2022-11-29 DIAGNOSIS — Z87898 Personal history of other specified conditions: Secondary | ICD-10-CM | POA: Diagnosis not present

## 2022-11-29 DIAGNOSIS — O99820 Streptococcus B carrier state complicating pregnancy: Secondary | ICD-10-CM | POA: Diagnosis not present

## 2022-11-29 DIAGNOSIS — O2613 Low weight gain in pregnancy, third trimester: Secondary | ICD-10-CM | POA: Diagnosis not present

## 2022-11-29 DIAGNOSIS — D573 Sickle-cell trait: Secondary | ICD-10-CM | POA: Diagnosis not present

## 2022-11-29 DIAGNOSIS — O0933 Supervision of pregnancy with insufficient antenatal care, third trimester: Secondary | ICD-10-CM | POA: Diagnosis not present

## 2022-11-29 DIAGNOSIS — Z01419 Encounter for gynecological examination (general) (routine) without abnormal findings: Secondary | ICD-10-CM | POA: Diagnosis not present

## 2022-11-29 DIAGNOSIS — O368130 Decreased fetal movements, third trimester, not applicable or unspecified: Secondary | ICD-10-CM | POA: Diagnosis not present

## 2022-12-12 ENCOUNTER — Encounter (HOSPITAL_COMMUNITY): Payer: Self-pay | Admitting: *Deleted

## 2022-12-12 ENCOUNTER — Telehealth (HOSPITAL_COMMUNITY): Payer: Self-pay | Admitting: *Deleted

## 2022-12-12 DIAGNOSIS — O2613 Low weight gain in pregnancy, third trimester: Secondary | ICD-10-CM | POA: Diagnosis not present

## 2022-12-12 DIAGNOSIS — Z87898 Personal history of other specified conditions: Secondary | ICD-10-CM | POA: Diagnosis not present

## 2022-12-12 DIAGNOSIS — O26849 Uterine size-date discrepancy, unspecified trimester: Secondary | ICD-10-CM | POA: Diagnosis not present

## 2022-12-12 DIAGNOSIS — Z3483 Encounter for supervision of other normal pregnancy, third trimester: Secondary | ICD-10-CM | POA: Diagnosis not present

## 2022-12-12 DIAGNOSIS — O480 Post-term pregnancy: Secondary | ICD-10-CM | POA: Diagnosis not present

## 2022-12-12 DIAGNOSIS — O99212 Obesity complicating pregnancy, second trimester: Secondary | ICD-10-CM | POA: Diagnosis not present

## 2022-12-12 DIAGNOSIS — O99820 Streptococcus B carrier state complicating pregnancy: Secondary | ICD-10-CM | POA: Diagnosis not present

## 2022-12-12 DIAGNOSIS — O0933 Supervision of pregnancy with insufficient antenatal care, third trimester: Secondary | ICD-10-CM | POA: Diagnosis not present

## 2022-12-12 DIAGNOSIS — Z8279 Family history of other congenital malformations, deformations and chromosomal abnormalities: Secondary | ICD-10-CM | POA: Diagnosis not present

## 2022-12-12 DIAGNOSIS — D573 Sickle-cell trait: Secondary | ICD-10-CM | POA: Diagnosis not present

## 2022-12-12 NOTE — Telephone Encounter (Signed)
Preadmission screen  

## 2022-12-13 ENCOUNTER — Other Ambulatory Visit: Payer: Self-pay

## 2022-12-13 ENCOUNTER — Encounter (HOSPITAL_COMMUNITY): Payer: Self-pay | Admitting: Obstetrics and Gynecology

## 2022-12-13 ENCOUNTER — Inpatient Hospital Stay (HOSPITAL_COMMUNITY)
Admission: AD | Admit: 2022-12-13 | Discharge: 2022-12-15 | DRG: 807 | Disposition: A | Discharge status: Home / Self Care | Payer: Medicaid Other | Attending: Obstetrics and Gynecology | Admitting: Obstetrics and Gynecology

## 2022-12-13 DIAGNOSIS — Z6791 Unspecified blood type, Rh negative: Secondary | ICD-10-CM | POA: Diagnosis not present

## 2022-12-13 DIAGNOSIS — O26893 Other specified pregnancy related conditions, third trimester: Secondary | ICD-10-CM | POA: Diagnosis not present

## 2022-12-13 DIAGNOSIS — O135 Gestational [pregnancy-induced] hypertension without significant proteinuria, complicating the puerperium: Secondary | ICD-10-CM | POA: Diagnosis not present

## 2022-12-13 DIAGNOSIS — Z23 Encounter for immunization: Secondary | ICD-10-CM | POA: Diagnosis not present

## 2022-12-13 DIAGNOSIS — Z8679 Personal history of other diseases of the circulatory system: Secondary | ICD-10-CM | POA: Diagnosis not present

## 2022-12-13 DIAGNOSIS — Z3A40 40 weeks gestation of pregnancy: Secondary | ICD-10-CM

## 2022-12-13 DIAGNOSIS — O480 Post-term pregnancy: Secondary | ICD-10-CM | POA: Diagnosis not present

## 2022-12-13 DIAGNOSIS — O99824 Streptococcus B carrier state complicating childbirth: Secondary | ICD-10-CM | POA: Diagnosis not present

## 2022-12-13 DIAGNOSIS — R7401 Elevation of levels of liver transaminase levels: Secondary | ICD-10-CM | POA: Diagnosis not present

## 2022-12-13 DIAGNOSIS — O165 Unspecified maternal hypertension, complicating the puerperium: Secondary | ICD-10-CM | POA: Diagnosis not present

## 2022-12-13 DIAGNOSIS — Z3483 Encounter for supervision of other normal pregnancy, third trimester: Secondary | ICD-10-CM | POA: Diagnosis not present

## 2022-12-13 DIAGNOSIS — R030 Elevated blood-pressure reading, without diagnosis of hypertension: Secondary | ICD-10-CM | POA: Diagnosis not present

## 2022-12-13 DIAGNOSIS — O99820 Streptococcus B carrier state complicating pregnancy: Secondary | ICD-10-CM | POA: Diagnosis not present

## 2022-12-13 LAB — CBC
HCT: 34.4 % — ABNORMAL LOW (ref 36.0–46.0)
Hemoglobin: 11.7 g/dL — ABNORMAL LOW (ref 12.0–15.0)
MCH: 28.2 pg (ref 26.0–34.0)
MCHC: 34 g/dL (ref 30.0–36.0)
MCV: 82.9 fL (ref 80.0–100.0)
Platelets: 190 10*3/uL (ref 150–400)
RBC: 4.15 MIL/uL (ref 3.87–5.11)
RDW: 13.4 % (ref 11.5–15.5)
WBC: 8.4 10*3/uL (ref 4.0–10.5)
nRBC: 0 % (ref 0.0–0.2)

## 2022-12-13 LAB — COMPREHENSIVE METABOLIC PANEL WITH GFR
ALT: 13 U/L (ref 0–44)
AST: 20 U/L (ref 15–41)
Albumin: 2.8 g/dL — ABNORMAL LOW (ref 3.5–5.0)
Alkaline Phosphatase: 73 U/L (ref 38–126)
Anion gap: 9 (ref 5–15)
BUN: 9 mg/dL (ref 6–20)
CO2: 21 mmol/L — ABNORMAL LOW (ref 22–32)
Calcium: 8.9 mg/dL (ref 8.9–10.3)
Chloride: 105 mmol/L (ref 98–111)
Creatinine, Ser: 0.63 mg/dL (ref 0.44–1.00)
GFR, Estimated: >60 mL/min
Glucose, Bld: 107 mg/dL — ABNORMAL HIGH (ref 70–99)
Potassium: 3.8 mmol/L (ref 3.5–5.1)
Sodium: 135 mmol/L (ref 135–145)
Total Bilirubin: 0.2 mg/dL — ABNORMAL LOW (ref 0.3–1.2)
Total Protein: 6.6 g/dL (ref 6.5–8.1)

## 2022-12-13 LAB — RPR
RPR Ser Ql: REACTIVE — AB
RPR Titer: 1:1 {titer}

## 2022-12-13 LAB — TYPE AND SCREEN
ABO/RH(D): O NEG
Antibody Screen: NEGATIVE

## 2022-12-13 MED ORDER — SODIUM CHLORIDE 0.9 % IV SOLN
5.0000 10*6.[IU] | Freq: Once | INTRAVENOUS | Status: AC
  Administered 2022-12-13: 5 10*6.[IU] via INTRAVENOUS
  Filled 2022-12-13: qty 5

## 2022-12-13 MED ORDER — ACETAMINOPHEN 325 MG PO TABS: 650.0000 mg | ORAL_TABLET | ORAL | Status: DC | PRN

## 2022-12-13 MED ORDER — SENNOSIDES-DOCUSATE SODIUM 8.6-50 MG PO TABS
2.0000 | ORAL_TABLET | Freq: Every day | ORAL | Status: DC
  Administered 2022-12-14 – 2022-12-15 (×2): 2 via ORAL
  Filled 2022-12-13 (×2): qty 2

## 2022-12-13 MED ORDER — COCONUT OIL OIL: 1.0000 | TOPICAL_OIL | Status: DC | PRN

## 2022-12-13 MED ORDER — FENTANYL CITRATE (PF) 100 MCG/2ML IJ SOLN
50.0000 ug | INTRAMUSCULAR | Status: DC | PRN
  Administered 2022-12-13: 100 ug via INTRAVENOUS
  Administered 2022-12-13: 50 ug via INTRAVENOUS
  Filled 2022-12-13 (×2): qty 2

## 2022-12-13 MED ORDER — OXYTOCIN-SODIUM CHLORIDE 30-0.9 UT/500ML-% IV SOLN
2.5000 [IU]/h | INTRAVENOUS | Status: DC
  Filled 2022-12-13: qty 500

## 2022-12-13 MED ORDER — SOD CITRATE-CITRIC ACID 500-334 MG/5ML PO SOLN: 30.0000 mL | ORAL | Status: DC | PRN

## 2022-12-13 MED ORDER — OXYTOCIN BOLUS FROM INFUSION
333.0000 mL | Freq: Once | INTRAVENOUS | Status: AC
  Administered 2022-12-13: 333 mL via INTRAVENOUS

## 2022-12-13 MED ORDER — IBUPROFEN 600 MG PO TABS
600.0000 mg | ORAL_TABLET | Freq: Four times a day (QID) | ORAL | Status: DC
  Administered 2022-12-13 – 2022-12-15 (×9): 600 mg via ORAL
  Filled 2022-12-13 (×9): qty 1

## 2022-12-13 MED ORDER — DIPHENHYDRAMINE HCL 25 MG PO CAPS: 25.0000 mg | ORAL_CAPSULE | Freq: Four times a day (QID) | ORAL | Status: DC | PRN

## 2022-12-13 MED ORDER — ONDANSETRON HCL 4 MG/2ML IJ SOLN: 4.0000 mg | Freq: Four times a day (QID) | INTRAMUSCULAR | Status: DC | PRN

## 2022-12-13 MED ORDER — DIBUCAINE (PERIANAL) 1 % EX OINT: 1.0000 | TOPICAL_OINTMENT | CUTANEOUS | Status: DC | PRN

## 2022-12-13 MED ORDER — PENICILLIN G POT IN DEXTROSE 60000 UNIT/ML IV SOLN: 3.0000 10*6.[IU] | INTRAVENOUS | Status: DC

## 2022-12-13 MED ORDER — ZOLPIDEM TARTRATE 5 MG PO TABS: 5.0000 mg | ORAL_TABLET | Freq: Every evening | ORAL | Status: DC | PRN

## 2022-12-13 MED ORDER — WITCH HAZEL-GLYCERIN EX PADS: 1.0000 | MEDICATED_PAD | CUTANEOUS | Status: DC | PRN

## 2022-12-13 MED ORDER — PRENATAL MULTIVITAMIN CH
1.0000 | ORAL_TABLET | Freq: Every day | ORAL | Status: DC
  Administered 2022-12-13 – 2022-12-15 (×3): 1 via ORAL
  Filled 2022-12-13 (×3): qty 1

## 2022-12-13 MED ORDER — LACTATED RINGERS IV SOLN: INTRAVENOUS | Status: DC

## 2022-12-13 MED ORDER — LACTATED RINGERS IV SOLN: 500.0000 mL | INTRAVENOUS | Status: DC | PRN

## 2022-12-13 MED ORDER — SIMETHICONE 80 MG PO CHEW: 80.0000 mg | CHEWABLE_TABLET | ORAL | Status: DC | PRN

## 2022-12-13 MED ORDER — ONDANSETRON HCL 4 MG/2ML IJ SOLN: 4.0000 mg | INTRAMUSCULAR | Status: DC | PRN

## 2022-12-13 MED ORDER — RHO D IMMUNE GLOBULIN 1500 UNIT/2ML IJ SOSY
300.0000 ug | PREFILLED_SYRINGE | Freq: Once | INTRAMUSCULAR | Status: DC
  Filled 2022-12-13: qty 2

## 2022-12-13 MED ORDER — LIDOCAINE HCL (PF) 1 % IJ SOLN: 30.0000 mL | INTRAMUSCULAR | Status: DC | PRN

## 2022-12-13 MED ORDER — MEDROXYPROGESTERONE ACETATE 150 MG/ML IM SUSP: 150.0000 mg | INTRAMUSCULAR | Status: DC | PRN

## 2022-12-13 MED ORDER — BENZOCAINE-MENTHOL 20-0.5 % EX AERO: 1.0000 | INHALATION_SPRAY | CUTANEOUS | Status: DC | PRN

## 2022-12-13 MED ORDER — ONDANSETRON HCL 4 MG PO TABS: 4.0000 mg | ORAL_TABLET | ORAL | Status: DC | PRN

## 2022-12-13 NOTE — Lactation Note (Signed)
This note was copied from a baby's chart. Lactation Consultation Note  Patient Name: Girl Raeanna Mirabelli [id number] Date: 12/13/2022 Age:28 hours  Reason for consult: Initial assessment;Term  [redacted]w[redacted]d GA  P5 mother with breastfeeding experience. Mother's primary language is Swahili and she speaks [place]. Declined need for an interpreter service. Mother is breast and formula feeding by choice. She has not latched baby yet and has fed infant formula several times.   Mother denies any questions, needs or concerns at this time. Encouraged to latch baby with feeding cues, before offering formula and calling for assistance as needed. Mom made aware of O/P services, breastfeeding support groups, community resources, and our phone # for post-discharge questions on handout given.    Maternal Data Has patient been taught Hand Expression?: No (visitors present) Does the patient have breastfeeding experience prior to this delivery?: Yes How long did the patient breastfeed?: breast fed other 4 children for 2 years, no problems  Feeding Mother's Current Feeding Choice: Breast Milk Nipple Type: Slow - flow  LATCH Score  Not observed  Interventions Interventions: Education;LC Services brochure   Consult Status Consult Status: Follow-up Date: 12/14/22 Follow-up type: In-patient    Christella Hartigan M 12/13/2022, 6:55 PM

## 2022-12-13 NOTE — Discharge Instructions (Signed)
Please follow up with the [facility] on [address] for a blood pressure check in 1 week  CALL THE HEALTH DEPT. AT [phone] to schedule your postpartum visit in 6 weeks

## 2022-12-13 NOTE — MAU Note (Signed)
Tracy Archer is a 27 y.o. at [redacted]w[redacted]d here in MAU reporting: ctx since 1800 tonight. Pt states they are every 3-4 minutes. Pt denies LOF or VB. +FM. Pt rates her ctx 6/10  Onset of complaint: 1800 12/12/2022 Pain score: 6/10 lower abdomen  Vitals:   12/13/22 0315 12/13/22 0321  BP: (!) 130/92 (!) 148/100  Pulse: 80 85  Resp: 18   Temp: 97.6 F (36.4 C)   SpO2: 97% 95%     FHT:123 Lab orders placed from triage:

## 2022-12-13 NOTE — Discharge Summary (Signed)
Postpartum Discharge Summary  Date of Service updated***     Patient Name: Tracy Archer DOB: 10-Apr-1996 MRN: [id number]  Date of admission: 12/13/2022 Delivery date:12/13/2022  Delivering provider:   Date of discharge: 12/13/2022  Admitting diagnosis: Indication for care in labor and delivery, antepartum [O75.9] Intrauterine pregnancy: [redacted]w[redacted]d     Secondary diagnosis:  Principal Problem:   Indication for care in labor and delivery, antepartum  Additional problems: none    Discharge diagnosis: Term Pregnancy Delivered                                              Post partum procedures:{Postpartum procedures:23558} Augmentation: N/A Complications: None  Hospital course: Onset of Labor With Vaginal Delivery      27 y.o. yo [id number] at [redacted]w[redacted]d was admitted in Active Labor on 12/13/2022. Labor course was uncomplicated. Membrane Rupture Time/Date: 6:15 AM ,12/13/2022   Delivery Method:Vaginal, Spontaneous  Episiotomy: None Lacerations:  Patient had a postpartum course complicated by ***.  She is ambulating, tolerating a regular diet, passing flatus, and urinating well. Patient is discharged home in stable condition on 12/13/22.  Newborn Data: Birth date:12/13/2022  Birth time:6:55 AM  Gender:Female  Living status:  Apgars: ,  Weight:   Magnesium Sulfate received: {Mag received:30440022} BMZ received: No Rhophylac:{Rhophylac received:30440032} MMR:N/A T-DaP:Given prenatally Flu: N/A Transfusion:{Transfusion received:30440034}  Physical exam  Vitals:   12/13/22 0330 12/13/22 0333 12/13/22 0337 12/13/22 0440  BP:  130/87  (!) 127/93  Pulse:  84  71  Resp:      Temp:      TempSrc:      SpO2: 99%  99%   Weight:       General: {Exam; general:21111117} Lochia: {Desc; appropriate/inappropriate:30686::"appropriate"} Uterine Fundus: {Desc; firm/soft:30687} Incision: {Exam; incision:21111123} DVT Evaluation: {Exam; dvt:2111122} Labs: Lab Results  Component Value Date    WBC 8.4 12/13/2022   HGB 11.7 (L) 12/13/2022   HCT 34.4 (L) 12/13/2022   MCV 82.9 12/13/2022   PLT 190 12/13/2022      Latest Ref Rng & Units 12/13/2022    3:34 AM  CMP  Glucose 70 - 99 mg/dL 147   BUN 6 - 20 mg/dL 9   Creatinine 8.29 - 5.62 mg/dL 1.30   Sodium 865 - 784 mmol/L 135   Potassium 3.5 - 5.1 mmol/L 3.8   Chloride 98 - 111 mmol/L 105   CO2 22 - 32 mmol/L 21   Calcium 8.9 - 10.3 mg/dL 8.9   Total Protein 6.5 - 8.1 g/dL 6.6   Total Bilirubin 0.3 - 1.2 mg/dL 0.2   Alkaline Phos 38 - 126 U/L 73   AST 15 - 41 U/L 20   ALT 0 - 44 U/L 13    Edinburgh Score:    11/08/2020    9:30 AM  Edinburgh Postnatal Depression Scale Screening Tool  I have been able to laugh and see the funny side of things. 3  I have looked forward with enjoyment to things. 1  I have blamed myself unnecessarily when things went wrong. 0  I have been anxious or worried for no good reason. 0  I have felt scared or panicky for no good reason. 0  Things have been getting on top of me. 1  I have been so unhappy that I have had difficulty sleeping. 0  I have felt sad or miserable.  0  I have been so unhappy that I have been crying. 0  The thought of harming myself has occurred to me. 0  Edinburgh Postnatal Depression Scale Total 5     After visit meds:  Allergies as of 12/13/2022   No Known Allergies   Med Rec must be completed prior to using this [facility]***        Discharge home in stable condition Infant Feeding: Bottle and Breast Infant Disposition:{CHL IP OB HOME WITH ZOXWRU:04540} Discharge instruction: per After Visit Summary and Postpartum booklet. Activity: Advance as tolerated. Pelvic rest for 6 weeks.  Diet: routine diet Future Appointments:No future appointments. Follow up Visit:  Messaged routed to scheduled 1 week BP check at [facility]. She will follow up for routine postpartum care at [facility] if BP well controlled at that time.   Please schedule this patient for a In person  postpartum visit in 1 week with the following provider: RN. Additional Postpartum F/U:BP check 1 week  Low risk pregnancy complicated by: HTN Delivery mode:  Vaginal, Spontaneous  Anticipated Birth Control:  Depo  12/13/2022 Lennart Pall, MD
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
## 2022-12-14 ENCOUNTER — Encounter (HOSPITAL_COMMUNITY): Payer: Self-pay | Admitting: Obstetrics and Gynecology

## 2022-12-14 DIAGNOSIS — O165 Unspecified maternal hypertension, complicating the puerperium: Secondary | ICD-10-CM | POA: Diagnosis not present

## 2022-12-14 DIAGNOSIS — Z6791 Unspecified blood type, Rh negative: Secondary | ICD-10-CM

## 2022-12-14 DIAGNOSIS — R7401 Elevation of levels of liver transaminase levels: Secondary | ICD-10-CM | POA: Diagnosis not present

## 2022-12-14 DIAGNOSIS — Z8679 Personal history of other diseases of the circulatory system: Secondary | ICD-10-CM | POA: Diagnosis not present

## 2022-12-14 LAB — COMPREHENSIVE METABOLIC PANEL
ALT: 28 U/L (ref 0–44)
AST: 57 U/L — ABNORMAL HIGH (ref 15–41)
Albumin: 2.7 g/dL — ABNORMAL LOW (ref 3.5–5.0)
Alkaline Phosphatase: 59 U/L (ref 38–126)
Anion gap: 10 (ref 5–15)
BUN: 8 mg/dL (ref 6–20)
CO2: 20 mmol/L — ABNORMAL LOW (ref 22–32)
Calcium: 8.7 mg/dL — ABNORMAL LOW (ref 8.9–10.3)
Chloride: 104 mmol/L (ref 98–111)
Creatinine, Ser: 0.77 mg/dL (ref 0.44–1.00)
GFR, Estimated: >60 mL/min (ref >60)
Glucose, Bld: 98 mg/dL (ref 70–99)
Potassium: 3.8 mmol/L (ref 3.5–5.1)
Sodium: 134 mmol/L — ABNORMAL LOW (ref 135–145)
Total Bilirubin: 0.2 mg/dL — ABNORMAL LOW (ref 0.3–1.2)
Total Protein: 6.2 g/dL — ABNORMAL LOW (ref 6.5–8.1)

## 2022-12-14 LAB — CBC
HCT: 33.4 % — ABNORMAL LOW (ref 36.0–46.0)
Hemoglobin: 11.5 g/dL — ABNORMAL LOW (ref 12.0–15.0)
MCH: 28.5 pg (ref 26.0–34.0)
MCHC: 34.4 g/dL (ref 30.0–36.0)
MCV: 82.9 fL (ref 80.0–100.0)
Platelets: 181 10*3/uL (ref 150–400)
RBC: 4.03 MIL/uL (ref 3.87–5.11)
RDW: 13.4 % (ref 11.5–15.5)
WBC: 8.5 10*3/uL (ref 4.0–10.5)
nRBC: 0 % (ref 0.0–0.2)

## 2022-12-14 LAB — KLEIHAUER-BETKE STAIN
# Vials RhIg: 1
Fetal Cells %: 0.1 %
Quantitation Fetal Hemoglobin: 5 mL

## 2022-12-14 LAB — RH IG WORKUP (INCLUDES ABO/RH)

## 2022-12-14 LAB — T.PALLIDUM AB, TOTAL: T Pallidum Abs: NONREACTIVE

## 2022-12-14 MED ORDER — NIFEDIPINE ER OSMOTIC RELEASE 30 MG PO TB24
30.0000 mg | ORAL_TABLET | Freq: Every day | ORAL | Status: DC
  Administered 2022-12-14 – 2022-12-15 (×2): 30 mg via ORAL
  Filled 2022-12-14 (×2): qty 1

## 2022-12-14 MED ORDER — FUROSEMIDE 20 MG PO TABS
20.0000 mg | ORAL_TABLET | Freq: Every day | ORAL | Status: DC
  Administered 2022-12-14 – 2022-12-15 (×2): 20 mg via ORAL
  Filled 2022-12-14 (×2): qty 1

## 2022-12-14 MED ORDER — RHO D IMMUNE GLOBULIN 1500 UNIT/2ML IJ SOSY
300.0000 ug | PREFILLED_SYRINGE | Freq: Once | INTRAMUSCULAR | Status: AC
  Administered 2022-12-14: 300 ug via INTRAMUSCULAR
  Filled 2022-12-14: qty 2
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
## 2022-12-15 ENCOUNTER — Other Ambulatory Visit (HOSPITAL_COMMUNITY): Payer: Self-pay

## 2022-12-15 LAB — COMPREHENSIVE METABOLIC PANEL
ALT: 26 U/L (ref 0–44)
AST: 45 U/L — ABNORMAL HIGH (ref 15–41)
Albumin: 2.5 g/dL — ABNORMAL LOW (ref 3.5–5.0)
Alkaline Phosphatase: 54 U/L (ref 38–126)
Anion gap: 6 (ref 5–15)
BUN: 7 mg/dL (ref 6–20)
CO2: 23 mmol/L (ref 22–32)
Calcium: 8.3 mg/dL — ABNORMAL LOW (ref 8.9–10.3)
Chloride: 107 mmol/L (ref 98–111)
Creatinine, Ser: 0.65 mg/dL (ref 0.44–1.00)
GFR, Estimated: >60 mL/min (ref >60)
Glucose, Bld: 72 mg/dL (ref 70–99)
Potassium: 3.6 mmol/L (ref 3.5–5.1)
Sodium: 136 mmol/L (ref 135–145)
Total Bilirubin: 0.1 mg/dL — ABNORMAL LOW (ref 0.3–1.2)
Total Protein: 6 g/dL — ABNORMAL LOW (ref 6.5–8.1)

## 2022-12-15 LAB — RH IG WORKUP (INCLUDES ABO/RH)
Gestational Age(Wks): 40.2
Unit division: 0

## 2022-12-15 LAB — CBC
HCT: 33.3 % — ABNORMAL LOW (ref 36.0–46.0)
Hemoglobin: 11.4 g/dL — ABNORMAL LOW (ref 12.0–15.0)
MCH: 28.3 pg (ref 26.0–34.0)
MCHC: 34.2 g/dL (ref 30.0–36.0)
MCV: 82.6 fL (ref 80.0–100.0)
Platelets: 171 10*3/uL (ref 150–400)
RBC: 4.03 MIL/uL (ref 3.87–5.11)
RDW: 13.5 % (ref 11.5–15.5)
WBC: 6.9 10*3/uL (ref 4.0–10.5)
nRBC: 0 % (ref 0.0–0.2)

## 2022-12-15 MED ORDER — NIFEDIPINE ER 30 MG PO TB24
30.0000 mg | ORAL_TABLET | Freq: Every day | ORAL | 0 refills | Status: DC
  Filled 2022-12-15: qty 60, 60d supply, fill #0

## 2022-12-15 MED ORDER — IBUPROFEN 600 MG PO TABS
600.0000 mg | ORAL_TABLET | Freq: Three times a day (TID) | ORAL | 0 refills | Status: AC | PRN
  Filled 2022-12-15: qty 30, 10d supply, fill #0

## 2022-12-15 MED ORDER — IBUPROFEN 600 MG PO TABS: 600.0000 mg | ORAL_TABLET | Freq: Three times a day (TID) | ORAL | 0 refills | Status: DC | PRN

## 2022-12-15 MED ORDER — FUROSEMIDE 20 MG PO TABS: 20.0000 mg | ORAL_TABLET | Freq: Every day | ORAL | 0 refills | Status: DC

## 2022-12-15 MED ORDER — NIFEDIPINE ER 30 MG PO TB24: 30.0000 mg | ORAL_TABLET | Freq: Every day | ORAL | 0 refills | Status: DC

## 2022-12-15 MED ORDER — FUROSEMIDE 20 MG PO TABS
20.0000 mg | ORAL_TABLET | Freq: Every day | ORAL | 0 refills | Status: DC
  Filled 2022-12-15: qty 5, 5d supply, fill #0

## 2022-12-15 NOTE — Progress Notes (Signed)
Discharge and safe sleep education completed via Swahili Interpreter: Fred [phone].

## 2022-12-18 ENCOUNTER — Inpatient Hospital Stay (HOSPITAL_COMMUNITY): Admission: RE | Admit: 2022-12-18 | Payer: Medicaid Other | Source: Home / Self Care | Admitting: Family Medicine

## 2022-12-18 ENCOUNTER — Inpatient Hospital Stay (HOSPITAL_COMMUNITY): Payer: Medicaid Other

## 2022-12-19 DIAGNOSIS — O99212 Obesity complicating pregnancy, second trimester: Secondary | ICD-10-CM | POA: Diagnosis not present

## 2022-12-19 DIAGNOSIS — O26849 Uterine size-date discrepancy, unspecified trimester: Secondary | ICD-10-CM | POA: Diagnosis not present

## 2022-12-21 ENCOUNTER — Ambulatory Visit: Payer: Medicaid Other

## 2023-01-04 ENCOUNTER — Telehealth (HOSPITAL_COMMUNITY): Payer: Self-pay | Admitting: *Deleted

## 2023-01-04 NOTE — Telephone Encounter (Signed)
01/04/2023  Name: Tracy Archer MRN: [id number] DOB: [birth year]  Reason for Call:  Transition of Care Hospital Discharge Call  Contact Status: Patient Contact Status: Complete  Language assistant needed: Interpreter Mode: Telephonic Interpreter Interpreter Name: Larita Fife [phone]        Follow-Up Questions: Do You Have Any Concerns About Your Health As You Heal From Delivery?: No Do You Have Any Concerns About Your Infants Health?: No  Edinburgh Postnatal Depression Scale:  In the Past 7 Days: I have been able to laugh and see the funny side of things.: Not quite so much now I have looked forward with enjoyment to things.: As much as I ever did I have blamed myself unnecessarily when things went wrong.: No, never I have been anxious or worried for no good reason.: No, not at all I have felt scared or panicky for no good reason.: No, not at all Things have been getting on top of me.: No, I have been coping as well as ever I have been so unhappy that I have had difficulty sleeping.: Not at all I have felt sad or miserable.: No, not at all I have been so unhappy that I have been crying.: No, never The thought of harming myself has occurred to me.: Never Edinburgh Postnatal Depression Scale Total: 1  PHQ2-9 Depression Scale:     Discharge Follow-up:    Post-discharge interventions: Reviewed Newborn Safe Sleep Practices  Salena Saner, RN 01/04/2023 15:03

## 2023-02-08 ENCOUNTER — Ambulatory Visit (INDEPENDENT_AMBULATORY_CARE_PROVIDER_SITE_OTHER): Payer: Medicaid Other | Admitting: Obstetrics and Gynecology

## 2023-02-08 ENCOUNTER — Encounter: Payer: Self-pay | Admitting: Obstetrics and Gynecology

## 2023-02-08 ENCOUNTER — Other Ambulatory Visit: Payer: Self-pay

## 2023-02-08 DIAGNOSIS — Z30017 Encounter for initial prescription of implantable subdermal contraceptive: Secondary | ICD-10-CM | POA: Diagnosis not present

## 2023-02-08 DIAGNOSIS — R7401 Elevation of levels of liver transaminase levels: Secondary | ICD-10-CM

## 2023-02-08 DIAGNOSIS — O165 Unspecified maternal hypertension, complicating the puerperium: Secondary | ICD-10-CM

## 2023-02-08 MED ORDER — ETONOGESTREL 68 MG ~~LOC~~ IMPL
68.0000 mg | DRUG_IMPLANT | Freq: Once | SUBCUTANEOUS | Status: AC
Start: 2023-02-08 — End: 2023-02-08
  Administered 2023-02-08: 68 mg via SUBCUTANEOUS

## 2023-02-08 MED ORDER — NIFEDIPINE ER 30 MG PO TB24: 30.0000 mg | ORAL_TABLET | Freq: Every day | ORAL | 0 refills | Status: DC

## 2023-02-08 NOTE — Progress Notes (Signed)
Pt wants Nexplanon for Birth Control       GYNECOLOGY CLINIC PROCEDURE NOTE  Tracy Archer is a 27 y.o. [id number] here for  Nexplanon insertion.  Last pap smear was on 5/24 and was normal.  No other gynecologic concerns.  Nexplanon Insertion Procedure Patient identified, informed consent performed, consent signed.   Patient does understand that irregular bleeding is a very common side effect of this medication. She was advised to have backup contraception for one week after placement. Pregnancy test in clinic today was negative.  Appropriate time out taken.  Patient's left arm was prepped and draped in the usual sterile fashion.. The ruler used to measure and mark insertion area.  Patient was prepped with alcohol swab and then injected with 3 ml of 1% lidocaine.  She was prepped with betadine, Nexplanon removed from packaging,  Device confirmed in needle, then inserted full length of needle and withdrawn per handbook instructions. Nexplanon was able to palpated in the patient's arm; patient palpated the insert herself. There was minimal blood loss.  Patient insertion site covered with guaze and a pressure bandage to reduce any bruising.  The patient tolerated the procedure well and was given post procedure instructions.    Nettie Elm, MD, FACOG Attending Obstetrician & Gynecologist Center for [facility]

## 2023-02-08 NOTE — Patient Instructions (Signed)

## 2023-02-08 NOTE — Progress Notes (Signed)
    Post Partum Visit Note  Shaquera Sandstedt is a 27 y.o. [id number] female who presents for a postpartum visit. She is 8 weeks postpartum following a normal spontaneous vaginal delivery.  I have fully reviewed the prenatal and intrapartum course. The delivery was at 40/2 gestational weeks.  Anesthesia: epidural. Postpartum course has been unremarkable. Baby is doing well. Baby is feeding by both breast and bottle - [place] Good Start. Bleeding no bleeding. Bowel function is normal. Bladder function is normal. Patient is not sexually active. Contraception method is none. Postpartum depression screening: negative.   The pregnancy intention screening data noted above was reviewed. Potential methods of contraception were discussed. The patient elected to proceed with No data recorded.    Health Maintenance Due  Topic Date Due   DTaP/Tdap/Td (1 - Tdap) Never done   PAP-Cervical Cytology Screening  Never done   PAP SMEAR-Modifier  Never done   COVID-19 Vaccine (1 - 2023-24 season) Never done   INFLUENZA VACCINE  01/26/2023    Medical Records  Review of Systems Pertinent items are noted in HPI.  Objective:  There were no vitals taken for this visit.   General:  alert   Breasts:  not indicated  Lungs: clear to auscultation bilaterally  Heart:  regular rate and rhythm, S1, S2 normal, no murmur, click, rub or gallop  Abdomen: soft, non-tender; bowel sounds normal; no masses,  no organomegaly   Wound NA  GU exam:  not indicated       Assessment:    There are no diagnoses linked to this encounter.  Nl postpartum exam.   Plan:   Essential components of care per ACOG recommendations:  1.  Mood and well being: Patient with negative depression screening today. Reviewed local resources for support.  - Patient tobacco use? No.   - hx of drug use? No.    2. Infant care and feeding:  -Patient currently breastmilk feeding? Yes -Social determinants of health (SDOH) reviewed in EPIC. No  concerns  3. Sexuality, contraception and birth spacing - Patient does not want a pregnancy in the next year.  Desired family size is uncertain.   - Reviewed reproductive life planning. Reviewed contraceptive methods based on pt preferences and effectiveness.  Patient desired Hormonal Implant today.   - Discussed birth spacing of 18 months  4. Sleep and fatigue -Encouraged family/partner/community support of 4 hrs of uninterrupted sleep to help with mood and fatigue  5. Physical Recovery  - Discussed patients delivery and complications. She describes her labor as good. - Patient had a Vaginal, no problems at delivery. Patient had a 1st degree laceration. Perineal healing reviewed. Patient expressed understanding - Patient has urinary incontinence? No. - Patient is safe to resume physical and sexual activity  6.  Health Maintenance - HM due items addressed Yes - Last pap smear 5/24 at [facility]  -Breast Cancer screening indicated? No.   7. Chronic Disease/Pregnancy Condition follow up: Hypertension  Will continue with Procardia every day Reevaluated BP at Nexplanon check  Live interrupter used during today's visit  Nettie Elm, MD Center for [employer], [facility]

## 2023-02-09 LAB — COMPREHENSIVE METABOLIC PANEL
ALT: 78 IU/L — ABNORMAL HIGH (ref 0–32)
AST: 50 IU/L — ABNORMAL HIGH (ref 0–40)
Albumin: 4.1 g/dL (ref 4.0–5.0)
Alkaline Phosphatase: 88 IU/L (ref 44–121)
BUN/Creatinine Ratio: 24 — ABNORMAL HIGH (ref 9–23)
BUN: 17 mg/dL (ref 6–20)
Bilirubin Total: 0.2 mg/dL (ref 0.0–1.2)
CO2: 22 mmol/L (ref 20–29)
Calcium: 9.3 mg/dL (ref 8.7–10.2)
Chloride: 105 mmol/L (ref 96–106)
Creatinine, Ser: 0.72 mg/dL (ref 0.57–1.00)
Globulin, Total: 2.8 g/dL (ref 1.5–4.5)
Glucose: 101 mg/dL — ABNORMAL HIGH (ref 70–99)
Potassium: 4.5 mmol/L (ref 3.5–5.2)
Sodium: 140 mmol/L (ref 134–144)
Total Protein: 6.9 g/dL (ref 6.0–8.5)
eGFR: 117 mL/min/{1.73_m2} (ref >59)

## 2023-02-10 ENCOUNTER — Telehealth: Payer: Self-pay | Admitting: *Deleted

## 2023-02-10 NOTE — Telephone Encounter (Signed)
-----   Message from Hermina Staggers sent at 02/09/2023 11:41 AM EDT ----- Please let pt know that her LFT's are still elevated. Please refer her to her PCP for further evaluation If she does not have one, refer to [facility]  Thanks Casimiro Needle

## 2023-02-10 NOTE — Telephone Encounter (Signed)
Per chart review has seen by [facility]. I called patient with [employer]pre[employer] #  [phone] and left a message I am calling with results and will call back or she can call us. Nancy Fetter

## 2023-02-13 NOTE — Telephone Encounter (Signed)
Called pt with [facility] interpreter ID [phone]. VM left at patient's phone number. Called pt contact; call was answered but no response after interpreter introduction. Interpreter disconnected the call and dialed again. VM left at patient contact's number on second attempt. Will send letter.

## 2023-03-15 ENCOUNTER — Ambulatory Visit: Payer: Medicaid Other | Admitting: Obstetrics and Gynecology

## 2023-04-03 ENCOUNTER — Ambulatory Visit: Payer: Medicaid Other | Admitting: Obstetrics and Gynecology

## 2023-04-12 ENCOUNTER — Ambulatory Visit: Payer: Medicaid Other | Admitting: Obstetrics and Gynecology

## 2023-05-11 ENCOUNTER — Ambulatory Visit: Payer: Medicaid Other | Admitting: Obstetrics and Gynecology

## 2023-11-10 ENCOUNTER — Ambulatory Visit: Payer: Self-pay | Admitting: Nurse Practitioner

## 2023-11-10 ENCOUNTER — Encounter: Payer: Self-pay | Admitting: Nurse Practitioner

## 2023-11-10 VITALS — BP 122/84 | HR 73 | Temp 97.2°F | Wt 223.0 lb

## 2023-11-10 DIAGNOSIS — Z8679 Personal history of other diseases of the circulatory system: Secondary | ICD-10-CM

## 2023-11-10 DIAGNOSIS — Z6838 Body mass index (BMI) 38.0-38.9, adult: Secondary | ICD-10-CM | POA: Diagnosis not present

## 2023-11-10 DIAGNOSIS — E66812 Obesity, class 2: Secondary | ICD-10-CM

## 2023-11-10 DIAGNOSIS — O165 Unspecified maternal hypertension, complicating the puerperium: Secondary | ICD-10-CM

## 2023-11-10 NOTE — Progress Notes (Addendum)
 New Patient Office Visit  Subjective:  Patient ID: Tracy Archer, female    DOB: February 10, 1996  Age: 29 y.o. MRN: [id number]  CC:  Chief Complaint  Patient presents with   Establish Care    HPI Tracy Archer is a 28 y.o. female  has a past medical history of Biological false positive RPR test (03/12/2016) and Postpartum hypertension.  Patient presents to establish care for chronic medical condition.  Has no previous PCP  Patient is accompanied by a medical interpreter who assisted with interpretation She denies any complaints today     Past Medical History:  Diagnosis Date   Biological false positive RPR test 03/12/2016   Quant was 1:1, negative confirmatory T.pallidum antibodies   Postpartum hypertension     Past Surgical History:  Procedure Laterality Date   NO PAST SURGERIES      History reviewed. No pertinent family history.  Social History   Socioeconomic History   Marital status: Married    Spouse name: Not on file   Number of children: 5   Years of education: Not on file   Highest education level: Not on file  Occupational History    Employer: [employer]   Occupation: [employer]  Tobacco Use   Smoking status: Never   Smokeless tobacco: Never  Vaping Use   Vaping status: Never Used  Substance and Sexual Activity   Alcohol use: No   Drug use: Yes   Sexual activity: Yes    Comment: nexplanon   Other Topics Concern   Not on file  Social History Narrative   Lives with her husband and children.    Social Drivers of Corporate investment banker Strain: Not on file  Food Insecurity: No Food Insecurity (12/13/2022)   Hunger Vital Sign    Worried About Running Out of Food in the Last Year: Never true    Ran Out of Food in the Last Year: Never true  Transportation Needs: No Transportation Needs (12/13/2022)   PRAPARE - Administrator, Civil Service (Medical): No    Lack of Transportation (Non-Medical): No  Physical Activity: Not on  file  Stress: Not on file  Social Connections: Not on file  Intimate Partner Violence: Not At Risk (12/13/2022)   Humiliation, Afraid, Rape, and Kick questionnaire    Fear of Current or Ex-Partner: No    Emotionally Abused: No    Physically Abused: No    Sexually Abused: No    ROS Review of Systems  Constitutional:  Negative for appetite change, chills, fatigue and fever.  HENT:  Negative for congestion, postnasal drip, rhinorrhea and sneezing.   Respiratory:  Negative for cough, shortness of breath and wheezing.   Cardiovascular:  Negative for chest pain, palpitations and leg swelling.  Gastrointestinal:  Negative for abdominal pain, constipation, nausea and vomiting.  Genitourinary:  Negative for difficulty urinating, dysuria, flank pain and frequency.  Musculoskeletal:  Negative for arthralgias, back pain, joint swelling and myalgias.  Skin:  Negative for color change, pallor, rash and wound.  Neurological:  Negative for dizziness, facial asymmetry, weakness, numbness and headaches.  Psychiatric/Behavioral:  Negative for behavioral problems, confusion, self-injury and suicidal ideas.     Objective:   Today's Vitals: BP 122/84   Pulse 73   Temp (!) 97.2 F (36.2 C)   Wt 223 lb (101.2 kg)   SpO2 100%   Breastfeeding Yes   BMI 38.28 kg/m   Physical Exam Vitals and nursing note reviewed.  Constitutional:  General: She is not in acute distress.    Appearance: Normal appearance. She is obese. She is not ill-appearing, toxic-appearing or diaphoretic.  HENT:     Mouth/Throat:     Mouth: Mucous membranes are moist.     Pharynx: Oropharynx is clear. No oropharyngeal exudate or posterior oropharyngeal erythema.  Eyes:     General: No scleral icterus.       Right eye: No discharge.        Left eye: No discharge.     Extraocular Movements: Extraocular movements intact.     Conjunctiva/sclera: Conjunctivae normal.  Cardiovascular:     Rate and Rhythm: Normal rate and  regular rhythm.     Pulses: Normal pulses.     Heart sounds: Normal heart sounds. No murmur heard.    No friction rub. No gallop.  Pulmonary:     Effort: Pulmonary effort is normal. No respiratory distress.     Breath sounds: Normal breath sounds. No stridor. No wheezing, rhonchi or rales.  Chest:     Chest wall: No tenderness.  Abdominal:     General: There is no distension.     Palpations: Abdomen is soft.     Tenderness: There is no abdominal tenderness. There is no right CVA tenderness, left CVA tenderness or guarding.  Musculoskeletal:        General: No swelling, tenderness, deformity or signs of injury.     Right lower leg: No edema.     Left lower leg: No edema.  Skin:    General: Skin is warm and dry.     Coloration: Skin is not jaundiced or pale.     Findings: No bruising, erythema or lesion.  Neurological:     Mental Status: She is alert and oriented to person, place, and time.     Motor: No weakness.     Coordination: Coordination normal.     Gait: Gait normal.  Psychiatric:        Mood and Affect: Mood normal.        Behavior: Behavior normal.        Thought Content: Thought content normal.        Judgment: Judgment normal.     Assessment & Plan:   Problem List Items Addressed This Visit       Other   History of hypertension   Has history of post partum hypertension  Blood pressure is currently well-controlled without medication DASH diet and commitment to daily physical activity for a minimum of 30 minutes discussed and encouraged, as a part of hypertension management. The importance of attaining a healthy weight is also discussed.     11/10/2023   10:47 AM 02/08/2023    9:56 AM 12/15/2022   11:35 AM 12/15/2022    4:50 AM 12/14/2022    8:23 PM 12/14/2022    4:31 PM 12/14/2022    1:00 PM  BP/Weight  Systolic BP 122 137 127 103 124 124 149  Diastolic BP 84 90 83 92 77 98 98  Wt. 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  She is on a general diet and does not exercise Patient counseled on low-carb diet Encouraged to engage in regular moderate to vigorous exercises at least  150 minutes weekly as tolerated Benefits of healthy weights discussed       Outpatient Encounter Medications as of 11/10/2023  Medication Sig   acetaminophen  (TYLENOL ) 325 MG tablet Take 2 tablets (650 mg total) by mouth every 4 (four) hours as needed (for pain scale < 4). (Patient not taking: Reported on 11/10/2023)   ibuprofen  (ADVIL ) 600 MG tablet Take 1 tablet (600 mg total) by mouth every 8 (eight) hours as needed for moderate pain. (Patient not taking: Reported on 11/10/2023)   [DISCONTINUED] NIFEdipine  (ADALAT  CC) 30 MG 24 hr tablet Take 1 tablet (30 mg total) by mouth daily. (Patient not taking: Reported on 11/10/2023)   No facility-administered encounter medications on file as of 11/10/2023.    Follow-up: Return in about 2 months (around 01/10/2024) for CPE.   Kenleigh Toback R Charnelle Bergeman, FNP

## 2023-11-10 NOTE — Patient Instructions (Signed)

## 2023-11-10 NOTE — Assessment & Plan Note (Addendum)
 Has history of post partum hypertension  Blood pressure is currently well-controlled without medication DASH diet and commitment to daily physical activity for a minimum of 30 minutes discussed and encouraged, as a part of hypertension management. The importance of attaining a healthy weight is also discussed.     11/10/2023   10:47 AM 02/08/2023    9:56 AM 12/15/2022   11:35 AM 12/15/2022    4:50 AM 12/14/2022    8:23 PM 12/14/2022    4:31 PM 12/14/2022    1:00 PM  BP/Weight  Systolic BP 122 137 127 103 124 124 149  Diastolic BP 84 90 83 92 77 98 98  Wt. (Lbs) 223 215       BMI 38.28 kg/m2 36.9 kg/m2

## 2023-11-10 NOTE — Assessment & Plan Note (Addendum)
 Wt Readings from Last 3 Encounters:  11/10/23 223 lb (101.2 kg)  02/08/23 215 lb (97.5 kg)  12/13/22 217 lb 6.4 oz (98.6 kg)   Body mass index is 38.28 kg/m.  She is on a general diet and does not exercise Patient counseled on low-carb diet Encouraged to engage in regular moderate to vigorous exercises at least  150 minutes weekly as tolerated Benefits of healthy weights discussed

## 2024-01-10 ENCOUNTER — Ambulatory Visit: Payer: Self-pay | Admitting: Nurse Practitioner

## 2024-01-10 ENCOUNTER — Encounter: Payer: Self-pay | Admitting: Nurse Practitioner

## 2024-01-10 VITALS — BP 112/71 | HR 72 | Temp 97.2°F | Wt 228.0 lb

## 2024-01-10 DIAGNOSIS — Z0000 Encounter for general adult medical examination without abnormal findings: Secondary | ICD-10-CM | POA: Insufficient documentation

## 2024-01-10 DIAGNOSIS — Z130 Encounter for screening for diseases of the blood and blood-forming organs and certain disorders involving the immune mechanism: Secondary | ICD-10-CM

## 2024-01-10 DIAGNOSIS — Z1329 Encounter for screening for other suspected endocrine disorder: Secondary | ICD-10-CM

## 2024-01-10 DIAGNOSIS — E6609 Other obesity due to excess calories: Secondary | ICD-10-CM | POA: Diagnosis not present

## 2024-01-10 DIAGNOSIS — Z13228 Encounter for screening for other metabolic disorders: Secondary | ICD-10-CM | POA: Diagnosis not present

## 2024-01-10 DIAGNOSIS — Z1321 Encounter for screening for nutritional disorder: Secondary | ICD-10-CM

## 2024-01-10 DIAGNOSIS — Z6838 Body mass index (BMI) 38.0-38.9, adult: Secondary | ICD-10-CM

## 2024-01-10 DIAGNOSIS — E66812 Obesity, class 2: Secondary | ICD-10-CM

## 2024-01-10 NOTE — Patient Instructions (Addendum)
 Please get HPV vaccine at the pharmacy if not already vaccinated     It is important that you exercise regularly at least 30 minutes 5 times a week as tolerated  Think about what you will eat, plan ahead. Choose  clean, green, fresh or frozen over canned, processed or packaged foods which are more sugary, salty and fatty. 70 to 75% of food eaten should be vegetables and fruit. Three meals at set times with snacks allowed between meals, but they must be fruit or vegetables. Aim to eat over a 12 hour period , example 7 am to 7 pm, and STOP after  your last meal of the day. Drink water,generally about 64 ounces per day, no other drink is as healthy. Fruit juice is best enjoyed in a healthy way, by EATING the fruit.  Thanks for choosing [facility] we consider it a privelige to serve you.

## 2024-01-10 NOTE — Assessment & Plan Note (Addendum)
 Annual exam as documented.  Counseling done include healthy lifestyle involving committing to 150 minutes of exercise per week, heart healthy diet, and attaining healthy weight. The importance of adequate sleep also discussed.  Regular use of seat belt and home safety were also discussed . Changes in health habits are decided on by patient with goals and time frames set for achieving them. Immunization and cancer screening  needs are specifically addressed at this visit.  Advised to get Tdap vaccine and HPV vaccine if not up-to-date

## 2024-01-10 NOTE — Assessment & Plan Note (Addendum)
 Wt Readings from Last 3 Encounters:  01/10/24 228 lb (103.4 kg)  11/10/23 223 lb (101.2 kg)  02/08/23 215 lb (97.5 kg)   Body mass index is 39.14 kg/m.  Patient has started 5 pounds since our last visit Counseled on low-carb diet, encouraged to engage in regular moderate to vigorous exercises at least 150 minutes weekly as tolerated she is interested in medication for weight loss but still breast-feeding will refill her to medical weight management clinic in the meantime. Benefits of healthy weight discussed

## 2024-01-10 NOTE — Progress Notes (Addendum)
 Complete physical exam  Patient: Tracy Archer   DOB: 06/26/1996   28 y.o. Female  MRN: [id number]  Subjective:    Chief Complaint  Patient presents with   Annual Exam    Tracy Archer is a 28 y.o. female  has a past medical history of Biological false positive RPR test (03/12/2016) and Postpartum hypertension. who presents today for a complete physical exam. She reports consuming a general diet.  Does walking exercises on weekends  She generally feels fairly well. She reports sleeping well. She does not have additional problems to discuss today.   Last pap smear was on 5/24 and it was normal per OBGYN  Interpretation services provided by medical interpreter    Most recent fall risk assessment:    09/11/2015   12:20 PM  Fall Risk   Falls in the past year? No      Data saved with a previous flowsheet row definition     Most recent depression screenings:    01/10/2024   10:38 AM 11/10/2023   10:51 AM  PHQ 2/9 Scores  PHQ - 2 Score 0 0  PHQ- 9 Score 0         Patient Care Team: Tracy Blass R, FNP as PCP - General (Nurse Practitioner)   Outpatient Medications Prior to Visit  Medication Sig   acetaminophen  (TYLENOL ) 325 MG tablet Take 2 tablets (650 mg total) by mouth every 4 (four) hours as needed (for pain scale < 4). (Patient not taking: Reported on 01/10/2024)   ibuprofen  (ADVIL ) 600 MG tablet Take 1 tablet (600 mg total) by mouth every 8 (eight) hours as needed for moderate pain. (Patient not taking: Reported on 01/10/2024)   No facility-administered medications prior to visit.    Review of Systems  Constitutional:  Negative for appetite change, chills, fatigue and fever.  HENT:  Negative for congestion, postnasal drip, rhinorrhea and sneezing.   Eyes:  Negative for pain, discharge and itching.  Respiratory:  Negative for cough, shortness of breath and wheezing.   Cardiovascular:  Negative for chest pain, palpitations and leg swelling.  Gastrointestinal:   Negative for abdominal pain, constipation, nausea and vomiting.  Endocrine: Negative for cold intolerance, heat intolerance and polydipsia.  Genitourinary:  Negative for difficulty urinating, dysuria, flank pain and frequency.  Musculoskeletal:  Negative for arthralgias, back pain, joint swelling and myalgias.  Skin:  Negative for color change, pallor, rash and wound.  Allergic/Immunologic: Negative for environmental allergies and food allergies.  Neurological:  Negative for dizziness, facial asymmetry, weakness, numbness and headaches.  Psychiatric/Behavioral:  Negative for behavioral problems, confusion, self-injury and suicidal ideas.        Objective:     BP 112/71   Pulse 72   Temp (!) 97.2 F (36.2 C)   Wt 228 lb (103.4 kg)   SpO2 100%   BMI 39.14 kg/m    Physical Exam Vitals and nursing note reviewed. Exam conducted with a chaperone present.  Constitutional:      General: She is not in acute distress.    Appearance: Normal appearance. She is obese. She is not ill-appearing, toxic-appearing or diaphoretic.  HENT:     Right Ear: Tympanic membrane, ear canal and external ear normal. There is no impacted cerumen.     Left Ear: Tympanic membrane, ear canal and external ear normal. There is no impacted cerumen.     Nose: Nose normal. No congestion or rhinorrhea.     Mouth/Throat:     Mouth: Mucous  membranes are moist.     Pharynx: Oropharynx is clear. No oropharyngeal exudate or posterior oropharyngeal erythema.  Eyes:     General: No scleral icterus.       Right eye: No discharge.        Left eye: No discharge.     Extraocular Movements: Extraocular movements intact.     Conjunctiva/sclera: Conjunctivae normal.  Neck:     Vascular: No carotid bruit.  Cardiovascular:     Rate and Rhythm: Normal rate and regular rhythm.     Pulses: Normal pulses.     Heart sounds: Normal heart sounds. No murmur heard.    No friction rub. No gallop.  Pulmonary:     Effort: Pulmonary  effort is normal. No respiratory distress.     Breath sounds: Normal breath sounds. No stridor. No wheezing, rhonchi or rales.  Chest:     Chest wall: No tenderness.  Abdominal:     General: Bowel sounds are normal. There is no distension.     Palpations: Abdomen is soft. There is no mass.     Tenderness: There is no abdominal tenderness. There is no right CVA tenderness, left CVA tenderness, guarding or rebound.     Hernia: No hernia is present.  Musculoskeletal:        General: No swelling, tenderness, deformity or signs of injury.     Cervical back: Normal range of motion and neck supple. No rigidity or tenderness.     Right lower leg: No edema.     Left lower leg: No edema.  Lymphadenopathy:     Cervical: No cervical adenopathy.  Skin:    General: Skin is warm and dry.     Capillary Refill: Capillary refill takes less than 2 seconds.     Coloration: Skin is not jaundiced or pale.     Findings: No bruising, erythema, lesion or rash.  Neurological:     Mental Status: She is alert and oriented to person, place, and time.     Cranial Nerves: No cranial nerve deficit.     Motor: No weakness.     Gait: Gait normal.  Psychiatric:        Mood and Affect: Mood normal.        Behavior: Behavior normal.        Thought Content: Thought content normal.        Judgment: Judgment normal.     No results found for any visits on 01/10/24.     Assessment & Plan:    Routine Health Maintenance and Physical Exam  There is no immunization history for the selected administration types on file for this patient.  Health Maintenance  Topic Date Due   DTaP/Tdap/Td (1 - Tdap) Never done   Hepatitis B Vaccines (1 of 3 - 19+ 3-dose series) Never done   Cervical Cancer Screening (Pap smear)  Never done   HPV VACCINES (1 - 3-dose SCDM series) Never done   COVID-19 Vaccine (1 - 2024-25 season) Never done   INFLUENZA VACCINE  01/26/2024   Hepatitis C Screening  Completed   HIV Screening   Completed   Meningococcal B Vaccine  Aged Out    Discussed health benefits of physical activity, and encouraged her to engage in regular exercise appropriate for her age and condition.  Problem List Items Addressed This Visit       Other   Class 2 obesity due to excess calories without serious comorbidity with body mass index (BMI) of 38.0  to 38.9 in adult   Wt Readings from Last 3 Encounters:  01/10/24 228 lb (103.4 kg)  11/10/23 223 lb (101.2 kg)  02/08/23 215 lb (97.5 kg)   Body mass index is 39.14 kg/m.  Patient has started 5 pounds since our last visit Counseled on low-carb diet, encouraged to engage in regular moderate to vigorous exercises at least 150 minutes weekly as tolerated she is interested in medication for weight loss but still breast-feeding will refill her to medical weight management clinic in the meantime. Benefits of healthy weight discussed      Relevant Orders   Amb Ref to Medical Weight Management   TSH   Annual physical exam - Primary   Annual exam as documented.  Counseling done include healthy lifestyle involving committing to 150 minutes of exercise per week, heart healthy diet, and attaining healthy weight. The importance of adequate sleep also discussed.  Regular use of seat belt and home safety were also discussed . Changes in health habits are decided on by patient with goals and time frames set for achieving them. Immunization and cancer screening  needs are specifically addressed at this visit.  Advised to get Tdap vaccine and HPV vaccine if not up-to-date      Other Visit Diagnoses       Screening for endocrine, nutritional, metabolic and immunity disorder       Relevant Orders   CBC   CMP14+EGFR   Lipid panel      Return in about 4 months (around 05/12/2024), or Obesity.     Arad Burston Archer Cicero Noy, FNP

## 2024-01-11 LAB — CMP14+EGFR
ALT: 25 IU/L (ref 0–32)
AST: 21 IU/L (ref 0–40)
Albumin: 4.2 g/dL (ref 4.0–5.0)
Alkaline Phosphatase: 71 IU/L (ref 44–121)
BUN/Creatinine Ratio: 16 (ref 9–23)
BUN: 12 mg/dL (ref 6–20)
Bilirubin Total: 0.4 mg/dL (ref 0.0–1.2)
CO2: 19 mmol/L — ABNORMAL LOW (ref 20–29)
Calcium: 9.6 mg/dL (ref 8.7–10.2)
Chloride: 104 mmol/L (ref 96–106)
Creatinine, Ser: 0.74 mg/dL (ref 0.57–1.00)
Globulin, Total: 3.1 g/dL (ref 1.5–4.5)
Glucose: 89 mg/dL (ref 70–99)
Potassium: 4.2 mmol/L (ref 3.5–5.2)
Sodium: 139 mmol/L (ref 134–144)
Total Protein: 7.3 g/dL (ref 6.0–8.5)
eGFR: 112 mL/min/1.73 (ref >59)

## 2024-01-11 LAB — LIPID PANEL
Chol/HDL Ratio: 4.5 ratio — ABNORMAL HIGH (ref 0.0–4.4)
Cholesterol, Total: 175 mg/dL (ref 100–199)
HDL: 39 mg/dL — ABNORMAL LOW (ref >39)
LDL Chol Calc (NIH): 126 mg/dL — ABNORMAL HIGH (ref 0–99)
Triglycerides: 53 mg/dL (ref 0–149)
VLDL Cholesterol Cal: 10 mg/dL (ref 5–40)

## 2024-01-11 LAB — CBC
Hematocrit: 40.4 % (ref 34.0–46.6)
Hemoglobin: 13.3 g/dL (ref 11.1–15.9)
MCH: 29.4 pg (ref 26.6–33.0)
MCHC: 32.9 g/dL (ref 31.5–35.7)
MCV: 89 fL (ref 79–97)
Platelets: 196 x10E3/uL (ref 150–450)
RBC: 4.52 x10E6/uL (ref 3.77–5.28)
RDW: 13.3 % (ref 11.7–15.4)
WBC: 5.9 x10E3/uL (ref 3.4–10.8)

## 2024-01-11 LAB — TSH: TSH: 2.37 u[IU]/mL (ref 0.450–4.500)

## 2024-01-12 ENCOUNTER — Ambulatory Visit: Payer: Self-pay | Admitting: Nurse Practitioner

## 2024-05-13 ENCOUNTER — Ambulatory Visit: Payer: Self-pay | Admitting: Nurse Practitioner
